# Patient Record
Sex: Male | Born: 1970 | Race: Black or African American | Hispanic: No | Marital: Married | Smoking: Former smoker
Health system: Southern US, Community
[De-identification: ages and names within clinical notes are randomized; demographics above are authoritative.]

## PROBLEM LIST (undated history)

## (undated) DIAGNOSIS — J189 Pneumonia, unspecified organism: Secondary | ICD-10-CM

## (undated) HISTORY — DX: Pneumonia, unspecified organism: J18.9

## (undated) HISTORY — PX: HAND TENDON SURGERY: SHX663

---

## 2011-02-05 ENCOUNTER — Emergency Department (HOSPITAL_COMMUNITY)
Admission: EM | Admit: 2011-02-05 | Discharge: 2011-02-05 | Disposition: A | Discharge status: Home / Self Care | Payer: PRIVATE HEALTH INSURANCE | Attending: Emergency Medicine | Admitting: Emergency Medicine

## 2011-02-05 ENCOUNTER — Encounter: Payer: Self-pay | Admitting: Emergency Medicine

## 2011-02-05 ENCOUNTER — Emergency Department (HOSPITAL_COMMUNITY): Payer: PRIVATE HEALTH INSURANCE

## 2011-02-05 DIAGNOSIS — R05 Cough: Secondary | ICD-10-CM | POA: Insufficient documentation

## 2011-02-05 DIAGNOSIS — F17200 Nicotine dependence, unspecified, uncomplicated: Secondary | ICD-10-CM | POA: Insufficient documentation

## 2011-02-05 DIAGNOSIS — R059 Cough, unspecified: Secondary | ICD-10-CM | POA: Insufficient documentation

## 2011-02-05 DIAGNOSIS — R1012 Left upper quadrant pain: Secondary | ICD-10-CM | POA: Insufficient documentation

## 2011-02-05 DIAGNOSIS — R1110 Vomiting, unspecified: Secondary | ICD-10-CM | POA: Insufficient documentation

## 2011-02-05 DIAGNOSIS — K219 Gastro-esophageal reflux disease without esophagitis: Secondary | ICD-10-CM | POA: Insufficient documentation

## 2011-02-05 LAB — DIFFERENTIAL
Basophils Absolute: 0 10*3/uL (ref 0.0–0.1)
Basophils Relative: 0 % (ref 0–1)
Eosinophils Absolute: 0.1 10*3/uL (ref 0.0–0.7)
Neutrophils Relative %: 57 % (ref 43–77)

## 2011-02-05 LAB — COMPREHENSIVE METABOLIC PANEL
ALT: 21 U/L (ref 0–53)
Albumin: 3.7 g/dL (ref 3.5–5.2)
Alkaline Phosphatase: 62 U/L (ref 39–117)
Potassium: 4 mEq/L (ref 3.5–5.1)
Sodium: 138 mEq/L (ref 135–145)
Total Protein: 7.2 g/dL (ref 6.0–8.3)

## 2011-02-05 LAB — CBC
MCH: 29.9 pg (ref 26.0–34.0)
MCHC: 34.5 g/dL (ref 30.0–36.0)
Platelets: 187 10*3/uL (ref 150–400)

## 2011-02-05 LAB — LIPASE, BLOOD: Lipase: 23 U/L (ref 11–59)

## 2011-02-05 MED ORDER — GI COCKTAIL ~~LOC~~
30.0000 mL | Freq: Once | ORAL | Status: AC
  Administered 2011-02-05: 30 mL via ORAL
  Filled 2011-02-05: qty 30

## 2011-02-05 MED ORDER — IOHEXOL 300 MG/ML  SOLN
100.0000 mL | Freq: Once | INTRAMUSCULAR | Status: AC | PRN
  Administered 2011-02-05: 100 mL via INTRAVENOUS

## 2011-02-05 MED ORDER — OMEPRAZOLE 20 MG PO CPDR: 40.0000 mg | DELAYED_RELEASE_CAPSULE | Freq: Every day | ORAL | Status: DC

## 2011-02-05 MED ORDER — SODIUM CHLORIDE 0.9 % IV BOLUS (SEPSIS): 1000.0000 mL | Freq: Once | INTRAVENOUS | Status: DC

## 2011-02-05 NOTE — ED Provider Notes (Signed)
Relates he's had left upper quadrant pain for the past month. He states sometimes it will radiate into his left flank. He states at times it is a burning pain. He denies that eating certain foods laying flat makes it worse. He states he started getting pain last night and has persisted through the night and today. He states now he has some burning in the epigastric area but it does not radiate into his chest. He states he's never had it before he has not noted anything that makes it better or worse. He denies any family history of kidney stones. Patient is awake and alert he is cooperative. On abdominal exam he does not have tenderness to palpation but he does indicate the left upper quadrant as the source of his pain. He does not have flank pain to palpation.  I saw and evaluated the patient, reviewed the resident's note and I agree with the findings and plan. Devoria Albe, MD, [facility]   Ward Givens, MD 02/05/11 [phone]

## 2011-02-05 NOTE — ED Provider Notes (Signed)
History     CSN: [id number] Arrival date & time: 02/05/2011  2:27 PM   First MD Initiated Contact with Patient 02/05/11 1552      Chief Complaint  Patient presents with  . Abdominal Pain    Left sided abdominal pain x 1 month    (Consider location/radiation/quality/duration/timing/severity/associated sxs/prior treatment) Patient is a 40 y.o. male presenting with abdominal pain. The history is provided by the patient.  Abdominal Pain The primary symptoms of the illness include abdominal pain and vomiting. The primary symptoms of the illness do not include diarrhea. Episode onset: 1 month ago. The onset of the illness was gradual. The problem has been gradually worsening.  Onset: 1 month ago. The pain came on gradually. The abdominal pain has been gradually worsening since its onset. The abdominal pain is located in the LUQ. The abdominal pain does not radiate. The abdominal pain is relieved by nothing.  The illness is associated with NSAID use. The patient states that she believes she is currently not pregnant. The patient has not had a change in bowel habit. Symptoms associated with the illness do not include chills, anorexia, heartburn, constipation or back pain. Associated symptoms comments: Vomiting, no nausea, indigestion, increased gas.  Marland Kitchen    History reviewed. No pertinent past medical history.  History reviewed. No pertinent past surgical history.  History reviewed. No pertinent family history.  History  Substance Use Topics  . Smoking status: Passive Smoker  . Smokeless tobacco: Never Used  . Alcohol Use: No      Review of Systems  Constitutional: Negative for chills.  Respiratory: Positive for cough.   Gastrointestinal: Positive for vomiting and abdominal pain. Negative for heartburn, diarrhea, constipation and anorexia.  Musculoskeletal: Negative for back pain.  All other systems reviewed and are negative.    Allergies  Review of patient's allergies indicates  no known allergies.  Home Medications   Current Outpatient Rx  Name Route Sig Dispense Refill  . IBUPROFEN 200 MG PO TABS Oral Take 400 mg by mouth every 6 (six) hours as needed. For pain.       BP 129/82  Pulse 82  Temp 98.1 F (36.7 C)  Resp 20  SpO2 99%  Physical Exam  Nursing note and vitals reviewed. Constitutional: He is oriented to person, place, and time. He appears well-developed and well-nourished. No distress.  HENT:  Head: Normocephalic and atraumatic.  Eyes: Conjunctivae are normal. Pupils are equal, round, and reactive to light.  Neck: Neck supple.  Cardiovascular: Normal rate and regular rhythm.   Pulmonary/Chest: Effort normal and breath sounds normal. He exhibits no tenderness.  Abdominal: Soft. There is no tenderness. There is no rebound and no guarding.  Musculoskeletal: Normal range of motion. He exhibits no edema and no tenderness.  Neurological: He is alert and oriented to person, place, and time. No cranial nerve deficit.  Skin: Skin is warm and dry. No erythema.    ED Course  Procedures (including critical care time)  Labs Reviewed  DIFFERENTIAL - Abnormal; Notable for the following:    Monocytes Relative 16 (*)    All other components within normal limits  CBC  COMPREHENSIVE METABOLIC PANEL  LIPASE, BLOOD  H. PYLORI ANTIBODY, IGG  URINALYSIS, ROUTINE W REFLEX MICROSCOPIC   Ct Abdomen Pelvis W Contrast  02/05/2011  *RADIOLOGY REPORT*  Clinical Data: Abdominal pain left-sided  CT ABDOMEN AND PELVIS WITH CONTRAST  Technique:  Multidetector CT imaging of the abdomen and pelvis was performed following the standard  protocol during bolus administration of intravenous contrast.  Contrast: OMNIPAQUE IOHEXOL 300 MG/ML IV SOLN  Comparison: None.  Findings: Lung bases are clear.  Hypodensity left lobe liver adjacent the falciform ligament as likely an area of focal fatty infiltration.  No other liver lesions.  Gallbladder and bile ducts are normal.   Pancreas spleen and kidneys are normal.  Negative for bowel obstruction.  No bowel thickening.  Appendix is normal.  No free fluid.  No mass or adenopathy.  IMPRESSION: Negative  Original Report Authenticated By: Camelia Phenes, M.D.     No diagnosis found.    MDM  Pt presented due to LUQ pain for about 1 month.  Reports burning sensation with intermittent sharp pain.  No aggrevating or alleving factors.  He is well appearing. Couple episodes of vomiting.  Has taken advil multiple times for back pain and headaches that are chronic in nature.  Abd exam is unremarkable.  Pt denies any reflux type symptoms.  Labs ordered.  Will CT abd/pelvis.  CT scan neg will d/c home.          Nena Alexander, MD 02/05/11 2138

## 2011-02-05 NOTE — ED Notes (Signed)
Pt reports left sided abdominal pain x 1 month. Pt denies nausea, vomiting or diarrhea.

## 2011-02-05 NOTE — ED Provider Notes (Signed)
See prior note   Ward Givens, MD 02/05/11 [phone]

## 2012-02-09 ENCOUNTER — Institutional Professional Consult (permissible substitution): Payer: PRIVATE HEALTH INSURANCE | Admitting: Cardiovascular Disease

## 2014-06-30 ENCOUNTER — Other Ambulatory Visit: Payer: Self-pay | Admitting: Physician Assistant

## 2014-06-30 ENCOUNTER — Ambulatory Visit (INDEPENDENT_AMBULATORY_CARE_PROVIDER_SITE_OTHER): Payer: BLUE CROSS/BLUE SHIELD | Admitting: Physician Assistant

## 2014-06-30 VITALS — BP 132/80 | HR 117 | Temp 98.7°F | Resp 17 | Ht 70.5 in | Wt 205.0 lb

## 2014-06-30 DIAGNOSIS — R000 Tachycardia, unspecified: Secondary | ICD-10-CM

## 2014-06-30 DIAGNOSIS — Z1329 Encounter for screening for other suspected endocrine disorder: Secondary | ICD-10-CM | POA: Diagnosis not present

## 2014-06-30 DIAGNOSIS — Z13228 Encounter for screening for other metabolic disorders: Secondary | ICD-10-CM

## 2014-06-30 DIAGNOSIS — Z13220 Encounter for screening for lipoid disorders: Secondary | ICD-10-CM | POA: Diagnosis not present

## 2014-06-30 DIAGNOSIS — Z0000 Encounter for general adult medical examination without abnormal findings: Secondary | ICD-10-CM | POA: Diagnosis not present

## 2014-06-30 DIAGNOSIS — Z125 Encounter for screening for malignant neoplasm of prostate: Secondary | ICD-10-CM | POA: Diagnosis not present

## 2014-06-30 DIAGNOSIS — Z130 Encounter for screening for diseases of the blood and blood-forming organs and certain disorders involving the immune mechanism: Secondary | ICD-10-CM | POA: Diagnosis not present

## 2014-06-30 LAB — POCT CBC
GRANULOCYTE PERCENT: 67.2 % (ref 37–80)
HCT, POC: 48.6 % (ref 43.5–53.7)
HEMOGLOBIN: 15.9 g/dL (ref 14.1–18.1)
LYMPH, POC: 2 (ref 0.6–3.4)
MCH, POC: 29.1 pg (ref 27–31.2)
MCHC: 32.6 g/dL (ref 31.8–35.4)
MCV: 89.1 fL (ref 80–97)
MID (CBC): 0.5 (ref 0–0.9)
MPV: 9.3 fL (ref 0–99.8)
POC GRANULOCYTE: 5 (ref 2–6.9)
POC LYMPH PERCENT: 26.5 %L (ref 10–50)
POC MID %: 6.3 % (ref 0–12)
Platelet Count, POC: 182 10*3/uL (ref 142–424)
RBC: 5.46 M/uL (ref 4.69–6.13)
RDW, POC: 13.1 %
WBC: 7.4 10*3/uL (ref 4.6–10.2)

## 2014-06-30 NOTE — Patient Instructions (Signed)
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
## 2014-07-01 LAB — COMPLETE METABOLIC PANEL WITH GFR
ALK PHOS: 57 U/L (ref 39–117)
ALT: 22 U/L (ref 0–53)
AST: 19 U/L (ref 0–37)
Albumin: 4 g/dL (ref 3.5–5.2)
BILIRUBIN TOTAL: 0.4 mg/dL (ref 0.2–1.2)
BUN: 14 mg/dL (ref 6–23)
CALCIUM: 9.3 mg/dL (ref 8.4–10.5)
CO2: 27 meq/L (ref 19–32)
CREATININE: 0.95 mg/dL (ref 0.50–1.35)
Chloride: 104 mEq/L (ref 96–112)
Glucose, Bld: 110 mg/dL — ABNORMAL HIGH (ref 70–99)
Potassium: 4.2 mEq/L (ref 3.5–5.3)
SODIUM: 140 meq/L (ref 135–145)
Total Protein: 6.9 g/dL (ref 6.0–8.3)

## 2014-07-02 LAB — T4, FREE: Free T4: 0.68 ng/dL — ABNORMAL LOW (ref 0.80–1.80)

## 2014-07-02 LAB — TSH: TSH: 10.53 u[IU]/mL — ABNORMAL HIGH (ref 0.350–4.500)

## 2014-07-02 LAB — T3, FREE: T3, Free: 2.9 pg/mL (ref 2.3–4.2)

## 2014-07-02 LAB — PSA: PSA: 0.54 ng/mL (ref <=4.00)

## 2014-07-03 LAB — LIPID PANEL

## 2014-07-06 ENCOUNTER — Telehealth: Payer: Self-pay | Admitting: Radiology

## 2014-07-06 NOTE — Telephone Encounter (Signed)
[facility] called and stated they did not have enough blood to run the Lipid panel

## 2014-07-09 NOTE — Telephone Encounter (Signed)
Did you want me to have pt RTC for redraw?

## 2014-07-09 NOTE — Telephone Encounter (Signed)
I will contact him about this.  I need to start him on a thyroid medication.  Thanks!!

## 2014-07-16 ENCOUNTER — Telehealth: Payer: Self-pay | Admitting: Physician Assistant

## 2014-07-16 DIAGNOSIS — E039 Hypothyroidism, unspecified: Secondary | ICD-10-CM

## 2014-07-16 MED ORDER — LEVOTHYROXINE SODIUM 50 MCG PO TABS: 50.0000 ug | ORAL_TABLET | Freq: Every day | ORAL | Status: DC

## 2014-07-16 NOTE — Telephone Encounter (Signed)
LM on voicemail: Hypothyroidism and ordered 50 levothyroxine.  He will need to return for recheck in 6 weeks.  He can return for a labs only lipid panel.

## 2014-07-22 ENCOUNTER — Telehealth: Payer: Self-pay | Admitting: Radiology

## 2014-07-25 ENCOUNTER — Other Ambulatory Visit (INDEPENDENT_AMBULATORY_CARE_PROVIDER_SITE_OTHER): Payer: BLUE CROSS/BLUE SHIELD | Admitting: Radiology

## 2014-07-25 ENCOUNTER — Other Ambulatory Visit: Payer: Self-pay | Admitting: Physician Assistant

## 2014-07-25 DIAGNOSIS — Z0000 Encounter for general adult medical examination without abnormal findings: Secondary | ICD-10-CM | POA: Diagnosis not present

## 2014-07-25 DIAGNOSIS — R7989 Other specified abnormal findings of blood chemistry: Secondary | ICD-10-CM

## 2014-07-25 DIAGNOSIS — Z13220 Encounter for screening for lipoid disorders: Secondary | ICD-10-CM

## 2014-07-25 LAB — LIPID PANEL
Cholesterol: 168 mg/dL (ref 0–200)
HDL: 43 mg/dL (ref >=40)
LDL CALC: 101 mg/dL — AB (ref 0–99)
Total CHOL/HDL Ratio: 3.9 Ratio
Triglycerides: 119 mg/dL (ref <150)
VLDL: 24 mg/dL (ref 0–40)

## 2014-07-25 LAB — T3, FREE: T3, Free: 3.5 pg/mL (ref 2.3–4.2)

## 2014-07-25 LAB — TSH: TSH: 9.617 u[IU]/mL — AB (ref 0.350–4.500)

## 2014-07-25 LAB — T4, FREE: FREE T4: 0.61 ng/dL — AB (ref 0.80–1.80)

## 2014-07-25 NOTE — Progress Notes (Signed)
Spoke with patient four days ago, who called with concerns of dizziness, fatigue, and feeling worse following the start of 50 levothyroxine.  I have advised him that he will come in for a labs only of tsh, free t4, free t3, and lipid panel.  We will recheck as symptoms are suggesting hyperthyroidism.  Patient has agreed and will return today.  Orders placed.

## 2014-07-25 NOTE — Progress Notes (Signed)
Lab only 

## 2014-08-22 ENCOUNTER — Telehealth: Payer: Self-pay

## 2014-08-22 DIAGNOSIS — R7989 Other specified abnormal findings of blood chemistry: Secondary | ICD-10-CM

## 2014-08-22 DIAGNOSIS — E039 Hypothyroidism, unspecified: Secondary | ICD-10-CM

## 2014-08-22 NOTE — Telephone Encounter (Signed)
He is referring to his thyroid, I believe. Endocrinologist?

## 2014-08-22 NOTE — Telephone Encounter (Signed)
Pt would like a referral for his throat. He can be reached at [phone]. He saw [place]ie English. Thank you

## 2014-08-30 ENCOUNTER — Other Ambulatory Visit: Payer: Self-pay | Admitting: Physician Assistant

## 2014-08-30 NOTE — Telephone Encounter (Signed)
I left voice message: I am unsure what he is referring to, but will make the referral.  Asked him to let us know if he is taking his thyroid medication, and how much.  What symptoms is he having.   He had called stating that he felt worse on the medication, and I had contacted him and advised him to take half the dose.  Again, I have made the referral to endocrinology, as I want to make sure that this TSH will go to a therapeutic level.

## 2014-10-03 ENCOUNTER — Ambulatory Visit: Payer: PRIVATE HEALTH INSURANCE | Admitting: Endocrinology

## 2014-11-14 ENCOUNTER — Encounter: Payer: Self-pay | Admitting: Endocrinology

## 2014-11-14 ENCOUNTER — Ambulatory Visit (INDEPENDENT_AMBULATORY_CARE_PROVIDER_SITE_OTHER): Payer: PRIVATE HEALTH INSURANCE | Admitting: Endocrinology

## 2014-11-14 ENCOUNTER — Ambulatory Visit: Payer: PRIVATE HEALTH INSURANCE | Admitting: Endocrinology

## 2014-11-14 VITALS — BP 114/70 | HR 76 | Temp 98.2°F | Resp 16 | Ht 70.5 in | Wt 211.0 lb

## 2014-11-14 DIAGNOSIS — E063 Autoimmune thyroiditis: Secondary | ICD-10-CM

## 2014-11-14 DIAGNOSIS — R6882 Decreased libido: Secondary | ICD-10-CM | POA: Diagnosis not present

## 2014-11-14 DIAGNOSIS — E038 Other specified hypothyroidism: Secondary | ICD-10-CM | POA: Diagnosis not present

## 2014-11-14 LAB — TSH: TSH: 4.74 u[IU]/mL — ABNORMAL HIGH (ref 0.35–4.50)

## 2014-11-14 LAB — T4, FREE: Free T4: 0.48 ng/dL — ABNORMAL LOW (ref 0.60–1.60)

## 2014-11-14 NOTE — Progress Notes (Signed)
Patient ID: Juan Richardson, male   DOB: 05/15/1970, 44 y.o.   MRN: [id number]            Reason for Appointment:  Hypothyroidism, new visit    History of Present Illness:   The Hypothyroidism was first diagnosed in 4/16   The symptoms consistent with hypothyroidism are: fatigue for about 1 year and some dry skin However the patient is somewhat inconsistent and unclear about his symptoms and difficult to get an accurate history  He was however having routine lab work done by his PCP in 4/16 when his TSH was found to be high at 10.5 He did not have any symptoms of cold sensitivity, difficulty concentrating or weight gain           The patient has been treated with levothyroxine 50 g daily which he took in April for about a week only  He says that when he started taking the medication he started feeling dizzy and also it was causing abdominal discomfort and he didn't feel well at all.  His TSH was repeated in about a week or 2 and was almost the same   The patient is not consistent in saying whether he is tired at this time or not. He is more concerned about some secretions in his throat in the morning and also occasional transient pinprick sensation in his chest    He is now here for further management    Lab Results  Component Value Date   FREET4 0.48* 11/14/2014   FREET4 0.61* 07/25/2014   FREET4 0.68* 06/30/2014   TSH 4.74* 11/14/2014   TSH 9.617* 07/25/2014   TSH 10.530* 06/30/2014     Past Medical History  Diagnosis Date  . Pneumonia     2013    History reviewed. No pertinent past surgical history.  Family History  Problem Relation Age of Onset  . Diabetes Mother     ?  Borderline diabetes  . Diabetes Maternal Uncle   . Thyroid disease Neg Hx     Social History:  reports that he has been passively smoking.  He has never used smokeless tobacco. He reports that he does not drink alcohol or use illicit drugs.  Allergies: No Known Allergies    Medication  List       This list is accurate as of: 11/14/14  3:03 PM.  Always use your most recent med list.               levothyroxine 50 MCG tablet  Commonly known as:  SYNTHROID, LEVOTHROID  Take 1 tablet (50 mcg total) by mouth daily.        Review of Systems:  Review of Systems  Constitutional: Negative for weight loss.  Eyes: Negative for blurred vision.  Respiratory: Positive for cough.   Gastrointestinal: Negative for constipation.  Genitourinary:       He has had decreased libido over the last year but no erectile dysfunction  Musculoskeletal: Negative for neck pain.  Neurological: Positive for dizziness and sensory change. Negative for headaches.       No recent dizziness, had more when he was trying to thyroid pill He sometimes feels, mostly at night  numbness in his hands  Psychiatric/Behavioral: Negative for depression. The patient has insomnia.     CARDIOLOGY: no history of high blood pressure.           ENDOCRINOLOGY:  no history of Diabetes.  Examination:    BP 114/70 mmHg  Pulse 76  Temp(Src) 98.2 F (36.8 C)  Resp 16  Ht 5' 10.5" (1.791 m)  Wt 211 lb (95.709 kg)  BMI 29.84 kg/m2  SpO2 98%  GENERAL:  Average build, well-nourished, looks well.   No pallor, clubbing, lymphadenopathy or edema.  Skin:  no rash or pigmentation.  EYES:  No prominence of the eyes or swelling of the eyelids  ENT: Oral mucosa and tongue normal.  Pharynx normal  THYROID:  Enlarged 2-2.5 times normal on the right side, smooth, firm, left side about 1.5-2 times normal, smooth.  No discrete nodule palpable.  No tenderness  HEART:  Normal  S1 and S2; no murmur or click.  CHEST:    Lungs: Vescicular breath sounds heard equally.  No crepitations/ wheeze.  ABDOMEN:  No distention.  Liver and spleen not palpable.  No other mass or tenderness.  NEUROLOGICAL: Reflexes are normal bilaterally at biceps and ankles.  JOINTS:  Normal.   Assessments  HYPOTHYROIDISM  with associated goiter, likely to be from Hashimoto thyroiditis  Although his TSH is mildly increased his free T4 is also low and not clear if he may also have concomitant secondary hypothyroidism  He claims that when he tried to take levothyroxine 50 g it made him feel dizzy and have abdominal distress even though he took it in the afternoon  However currently he does not appear to be significantly symptomatic and does not complain of much fatigue He has some nonspecific systemic symptoms Also may have some  symptoms suggestive of carpal tunnel syndrome  Treatment:    Recheck thyroid levels today   Check free testosterone level also to rule out hypogonadism since he has decreased libido and potentially may have pituitary dysfunction  Further management will depend on repeat testing. Discussed that he may be able to tolerate a lower dose of levothyroxine initially Discussed that levothyroxine this identical to the natural thyroid hormone made by the thyroid gland and also is described the potential symptoms of hypothyroidism Given him brochures on Hashimoto thyroiditis  Chantia Amalfitano 11/14/2014, 3:03 PM

## 2014-11-15 LAB — TESTOSTERONE, FREE, TOTAL, SHBG
SEX HORMONE BINDING: 18.8 nmol/L (ref 16.5–55.9)
TESTOSTERONE FREE: 4.9 pg/mL — AB (ref 6.8–21.5)
Testosterone: 157 ng/dL — ABNORMAL LOW (ref 348–1197)

## 2014-11-17 NOTE — Progress Notes (Signed)
Quick Note:  Please add LH, prolactin, IGF-I from [facility] for diagnosis of hypopituitarism, let him know testosterone level is low and we are evaluating further, hold off on thyroid medication for now ______

## 2014-11-19 LAB — SPECIMEN STATUS REPORT

## 2014-11-21 ENCOUNTER — Ambulatory Visit (INDEPENDENT_AMBULATORY_CARE_PROVIDER_SITE_OTHER): Payer: BLUE CROSS/BLUE SHIELD | Admitting: Emergency Medicine

## 2014-11-21 VITALS — BP 116/72 | HR 98 | Temp 99.3°F | Resp 18 | Ht 70.0 in | Wt 208.0 lb

## 2014-11-21 DIAGNOSIS — L500 Allergic urticaria: Secondary | ICD-10-CM

## 2014-11-21 MED ORDER — PREDNISONE 10 MG (48) PO TBPK: ORAL_TABLET | ORAL | Status: DC

## 2014-11-21 MED ORDER — HYDROXYZINE HCL 25 MG PO TABS: 25.0000 mg | ORAL_TABLET | Freq: Four times a day (QID) | ORAL | Status: AC | PRN

## 2014-11-21 MED ORDER — CIMETIDINE 800 MG PO TABS: 800.0000 mg | ORAL_TABLET | Freq: Every day | ORAL | Status: DC

## 2014-11-21 NOTE — Progress Notes (Signed)
Subjective:  Patient ID: Juan Richardson, male    DOB: 03/01/1971  Age: 44 y.o. MRN: [id number]  CC: Rash; Edema; Fever; Palpitations; and Abdominal Pain   HPI Juan Richardson presents  patient was at work 2 days ago developed generalized hives following's when he believes the chemical exposure work. His itching is so severe that it's keeping him up at night. He has had some nausea and diarrhea associated with the urticaria. He has no fever chills no blood mucus or pus in stools. No cough or wheezing he's had no improvement with over-the-counter medication   History Juan Richardson has a past medical history of Pneumonia.   He has past surgical history that includes Hand tendon surgery.   His  family history includes Diabetes in his maternal uncle and mother; Hypertension in his mother. There is no history of Thyroid disease.  He   reports that he has been passively smoking.  He has never used smokeless tobacco. He reports that he does not drink alcohol or use illicit drugs.  Outpatient Prescriptions Prior to Visit  Medication Sig Dispense Refill  . levothyroxine (SYNTHROID, LEVOTHROID) 50 MCG tablet Take 1 tablet (50 mcg total) by mouth daily. (Patient not taking: Reported on 11/14/2014) 90 tablet 0   No facility-administered medications prior to visit.    Social History   Social History  . Marital Status: Married    Spouse Name: N/A  . Number of Children: N/A  . Years of Education: N/A   Social History Main Topics  . Smoking status: Passive Smoke Exposure - Never Smoker  . Smokeless tobacco: Never Used  . Alcohol Use: No  . Drug Use: No  . Sexual Activity: Not Asked   Other Topics Concern  . None   Social History Narrative     Review of Systems  Constitutional: Negative for fever, chills and appetite change.  HENT: Negative for congestion, ear pain, postnasal drip, sinus pressure and sore throat.   Eyes: Negative for pain and redness.  Respiratory: Negative for cough,  shortness of breath and wheezing.   Cardiovascular: Negative for leg swelling.  Gastrointestinal: Negative for nausea, vomiting, abdominal pain, diarrhea, constipation and blood in stool.  Endocrine: Negative for polyuria.  Genitourinary: Negative for dysuria, urgency, frequency and flank pain.  Musculoskeletal: Negative for gait problem.  Skin: Positive for rash.  Neurological: Negative for weakness and headaches.  Psychiatric/Behavioral: Negative for confusion and decreased concentration. The patient is not nervous/anxious.     Objective:  BP 116/72 mmHg  Pulse 98  Temp(Src) 99.3 F (37.4 C) (Oral)  Resp 18  Ht 5\' 10"  (1.778 m)  Wt 208 lb (94.348 kg)  BMI 29.84 kg/m2  SpO2 98%  Physical Exam  Constitutional: He is oriented to person, place, and time. He appears well-developed and well-nourished. No distress.  HENT:  Head: Normocephalic and atraumatic.  Right Ear: External ear normal.  Left Ear: External ear normal.  Nose: Nose normal.  Eyes: Conjunctivae and EOM are normal. Pupils are equal, round, and reactive to light. No scleral icterus.  Neck: Normal range of motion. Neck supple. No tracheal deviation present.  Cardiovascular: Normal rate, regular rhythm and normal heart sounds.   Pulmonary/Chest: Effort normal. No respiratory distress. He has no wheezes. He has no rales.  Abdominal: He exhibits no mass. There is no tenderness. There is no rebound and no guarding.  Musculoskeletal: He exhibits no edema.  Lymphadenopathy:    He has no cervical adenopathy.  Neurological: He is alert and  oriented to person, place, and time. Coordination normal.  Skin: Skin is warm and dry. Rash noted.  Psychiatric: He has a normal mood and affect. His behavior is normal.   he has generalized urticaria    Assessment & Plan:   Juan Richardson was seen today for rash, edema, fever, palpitations and abdominal pain.  Diagnoses and all orders for this visit:  Allergic urticaria -     Ambulatory  referral to Allergy  Other orders -     hydrOXYzine (ATARAX/VISTARIL) 25 MG tablet; Take 1 tablet (25 mg total) by mouth every 6 (six) hours as needed for itching. -     cimetidine (TAGAMET) 800 MG tablet; Take 1 tablet (800 mg total) by mouth at bedtime. -     predniSONE (STERAPRED UNI-PAK 48 TAB) 10 MG (48) TBPK tablet; TAKE AS DIRECTED ON PACKAGE  I am having [name] start on hydrOXYzine, cimetidine, and predniSONE. I am also having him maintain his levothyroxine.  Meds ordered this encounter  Medications  . hydrOXYzine (ATARAX/VISTARIL) 25 MG tablet    Sig: Take 1 tablet (25 mg total) by mouth every 6 (six) hours as needed for itching.    Dispense:  40 tablet    Refill:  0  . cimetidine (TAGAMET) 800 MG tablet    Sig: Take 1 tablet (800 mg total) by mouth at bedtime.    Dispense:  30 tablet    Refill:  0  . predniSONE (STERAPRED UNI-PAK 48 TAB) 10 MG (48) TBPK tablet    Sig: TAKE AS DIRECTED ON PACKAGE    Dispense:  48 tablet    Refill:  0    Appropriate red flag conditions were discussed with the patient as well as actions that should be taken.  Patient expressed his understanding.  Follow-up: Return if symptoms worsen or fail to improve.  Carmelina Dane, MD

## 2014-11-21 NOTE — Patient Instructions (Signed)
Hives Hives are itchy, red, swollen areas of the skin. They can vary in size and location on your body. Hives can come and go for hours or several days (acute hives) or for several weeks (chronic hives). Hives do not spread from person to person (noncontagious). They may get worse with scratching, exercise, and emotional stress. CAUSES   Allergic reaction to food, additives, or drugs.  Infections, including the common cold.  Illness, such as vasculitis, lupus, or thyroid disease.  Exposure to sunlight, heat, or cold.  Exercise.  Stress.  Contact with chemicals. SYMPTOMS   Red or white swollen patches on the skin. The patches may change size, shape, and location quickly and repeatedly.  Itching.  Swelling of the hands, feet, and face. This may occur if hives develop deeper in the skin. DIAGNOSIS  Your caregiver can usually tell what is wrong by performing a physical exam. Skin or blood tests may also be done to determine the cause of your hives. In some cases, the cause cannot be determined. TREATMENT  Mild cases usually get better with medicines such as antihistamines. Severe cases may require an emergency epinephrine injection. If the cause of your hives is known, treatment includes avoiding that trigger.  HOME CARE INSTRUCTIONS   Avoid causes that trigger your hives.  Take antihistamines as directed by your caregiver to reduce the severity of your hives. Non-sedating or low-sedating antihistamines are usually recommended. Do not drive while taking an antihistamine.  Take any other medicines prescribed for itching as directed by your caregiver.  Wear loose-fitting clothing.  Keep all follow-up appointments as directed by your caregiver. SEEK MEDICAL CARE IF:   You have persistent or severe itching that is not relieved with medicine.  You have painful or swollen joints. SEEK IMMEDIATE MEDICAL CARE IF:   You have a fever.  Your tongue or lips are swollen.  You have  trouble breathing or swallowing.  You feel tightness in the throat or chest.  You have abdominal pain. These problems may be the first sign of a life-threatening allergic reaction. Call your local emergency services (911 in [place] MAKE SURE YOU:   Understand these instructions.  Will watch your condition.  Will get help right away if you are not doing well or get worse. Document Released: 03/07/2005 Document Revised: 03/12/2013 Document Reviewed: 05/31/2011 [employer] Patient Information 2015 [employer]. This information is not intended to replace advice given to you by your health care provider. Make sure you discuss any questions you have with your health care provider.  

## 2014-11-22 ENCOUNTER — Emergency Department (HOSPITAL_COMMUNITY): Payer: BLUE CROSS/BLUE SHIELD

## 2014-11-22 ENCOUNTER — Encounter (HOSPITAL_COMMUNITY): Payer: Self-pay | Admitting: Nurse Practitioner

## 2014-11-22 ENCOUNTER — Emergency Department (HOSPITAL_COMMUNITY)
Admission: EM | Admit: 2014-11-22 | Discharge: 2014-11-22 | Disposition: A | Discharge status: Home / Self Care | Payer: BLUE CROSS/BLUE SHIELD | Attending: Emergency Medicine | Admitting: Emergency Medicine

## 2014-11-22 DIAGNOSIS — Y939 Activity, unspecified: Secondary | ICD-10-CM | POA: Diagnosis not present

## 2014-11-22 DIAGNOSIS — Z79899 Other long term (current) drug therapy: Secondary | ICD-10-CM | POA: Diagnosis not present

## 2014-11-22 DIAGNOSIS — Y929 Unspecified place or not applicable: Secondary | ICD-10-CM | POA: Insufficient documentation

## 2014-11-22 DIAGNOSIS — Y998 Other external cause status: Secondary | ICD-10-CM | POA: Insufficient documentation

## 2014-11-22 DIAGNOSIS — T7840XA Allergy, unspecified, initial encounter: Secondary | ICD-10-CM | POA: Diagnosis not present

## 2014-11-22 DIAGNOSIS — Z8701 Personal history of pneumonia (recurrent): Secondary | ICD-10-CM | POA: Diagnosis not present

## 2014-11-22 DIAGNOSIS — L509 Urticaria, unspecified: Secondary | ICD-10-CM | POA: Diagnosis not present

## 2014-11-22 DIAGNOSIS — X58XXXA Exposure to other specified factors, initial encounter: Secondary | ICD-10-CM | POA: Diagnosis not present

## 2014-11-22 DIAGNOSIS — K1379 Other lesions of oral mucosa: Secondary | ICD-10-CM | POA: Insufficient documentation

## 2014-11-22 DIAGNOSIS — R109 Unspecified abdominal pain: Secondary | ICD-10-CM | POA: Diagnosis present

## 2014-11-22 LAB — CBC
HEMATOCRIT: 44.8 % (ref 39.0–52.0)
Hemoglobin: 15.7 g/dL (ref 13.0–17.0)
MCH: 30.4 pg (ref 26.0–34.0)
MCHC: 35 g/dL (ref 30.0–36.0)
MCV: 86.8 fL (ref 78.0–100.0)
Platelets: 193 10*3/uL (ref 150–400)
RBC: 5.16 MIL/uL (ref 4.22–5.81)
RDW: 12.5 % (ref 11.5–15.5)
WBC: 13.9 10*3/uL — AB (ref 4.0–10.5)

## 2014-11-22 LAB — URINALYSIS, ROUTINE W REFLEX MICROSCOPIC
Bilirubin Urine: NEGATIVE
GLUCOSE, UA: NEGATIVE mg/dL
Ketones, ur: NEGATIVE mg/dL
Leukocytes, UA: NEGATIVE
Nitrite: NEGATIVE
Protein, ur: NEGATIVE mg/dL
SPECIFIC GRAVITY, URINE: 1.008 (ref 1.005–1.030)
Urobilinogen, UA: 0.2 mg/dL (ref 0.0–1.0)
pH: 6.5 (ref 5.0–8.0)

## 2014-11-22 LAB — PROLACTIN

## 2014-11-22 LAB — COMPREHENSIVE METABOLIC PANEL
ALT: 24 U/L (ref 17–63)
AST: 22 U/L (ref 15–41)
Albumin: 3.6 g/dL (ref 3.5–5.0)
Alkaline Phosphatase: 43 U/L (ref 38–126)
Anion gap: 7 (ref 5–15)
BUN: 8 mg/dL (ref 6–20)
CHLORIDE: 102 mmol/L (ref 101–111)
CO2: 25 mmol/L (ref 22–32)
Calcium: 8.7 mg/dL — ABNORMAL LOW (ref 8.9–10.3)
Creatinine, Ser: 1.22 mg/dL (ref 0.61–1.24)
Glucose, Bld: 135 mg/dL — ABNORMAL HIGH (ref 65–99)
POTASSIUM: 3.7 mmol/L (ref 3.5–5.1)
SODIUM: 134 mmol/L — AB (ref 135–145)
Total Bilirubin: 1.4 mg/dL — ABNORMAL HIGH (ref 0.3–1.2)
Total Protein: 6.2 g/dL — ABNORMAL LOW (ref 6.5–8.1)

## 2014-11-22 LAB — INSULIN-LIKE GROWTH FACTOR: INSULIN LIKE GF 1: 114 ng/mL (ref 75–216)

## 2014-11-22 LAB — LUTEINIZING HORMONE: LH: 3 m[IU]/mL (ref 1.7–8.6)

## 2014-11-22 LAB — URINE MICROSCOPIC-ADD ON

## 2014-11-22 LAB — LIPASE, BLOOD: Lipase: 14 U/L — ABNORMAL LOW (ref 22–51)

## 2014-11-22 LAB — SPECIMEN STATUS REPORT

## 2014-11-22 MED ORDER — KETOROLAC TROMETHAMINE 30 MG/ML IJ SOLN
30.0000 mg | Freq: Once | INTRAMUSCULAR | Status: AC
  Administered 2014-11-22: 30 mg via INTRAVENOUS
  Filled 2014-11-22: qty 1

## 2014-11-22 MED ORDER — IBUPROFEN 100 MG/5ML PO SUSP: 600.0000 mg | Freq: Four times a day (QID) | ORAL | Status: DC | PRN

## 2014-11-22 MED ORDER — ONDANSETRON 4 MG PO TBDP: ORAL_TABLET | ORAL | Status: DC

## 2014-11-22 MED ORDER — METHYLPREDNISOLONE SODIUM SUCC 125 MG IJ SOLR
125.0000 mg | Freq: Once | INTRAMUSCULAR | Status: AC
  Administered 2014-11-22: 125 mg via INTRAVENOUS
  Filled 2014-11-22: qty 2

## 2014-11-22 MED ORDER — FAMOTIDINE 40 MG/5ML PO SUSR: 20.0000 mg | Freq: Every day | ORAL | Status: AC

## 2014-11-22 MED ORDER — DIPHENHYDRAMINE HCL 12.5 MG/5ML PO SYRP: 25.0000 mg | ORAL_SOLUTION | Freq: Four times a day (QID) | ORAL | Status: AC

## 2014-11-22 MED ORDER — DIPHENHYDRAMINE HCL 50 MG/ML IJ SOLN
25.0000 mg | Freq: Once | INTRAMUSCULAR | Status: AC
  Administered 2014-11-22: 25 mg via INTRAVENOUS
  Filled 2014-11-22: qty 1

## 2014-11-22 MED ORDER — FAMOTIDINE IN NACL 20-0.9 MG/50ML-% IV SOLN
20.0000 mg | Freq: Once | INTRAVENOUS | Status: AC
  Administered 2014-11-22: 20 mg via INTRAVENOUS
  Filled 2014-11-22: qty 50

## 2014-11-22 MED ORDER — ONDANSETRON HCL 4 MG/2ML IJ SOLN
4.0000 mg | Freq: Once | INTRAMUSCULAR | Status: AC
  Administered 2014-11-22: 4 mg via INTRAVENOUS
  Filled 2014-11-22: qty 2

## 2014-11-22 MED ORDER — PREDNISOLONE 15 MG/5ML PO SOLN: 60.0000 mg | Freq: Every day | ORAL | Status: AC

## 2014-11-22 MED ORDER — SODIUM CHLORIDE 0.9 % IV BOLUS (SEPSIS)
1000.0000 mL | Freq: Once | INTRAVENOUS | Status: AC
  Administered 2014-11-22: 1000 mL via INTRAVENOUS

## 2014-11-22 NOTE — Discharge Instructions (Signed)
Read the information below.  Use the prescribed medication as directed.  Please discuss all new medications with your pharmacist.  You may return to the Emergency Department at any time for worsening condition or any new symptoms that concern you.   If you develop itching or swelling in your mouth or throat or any difficulty swallowing or breathing, call 911 or return to the Emergency Department immediately for a recheck.   If you develop high fevers, worsening abdominal pain, uncontrolled vomiting, or are unable to tolerate fluids by mouth, return to the ER for a recheck.     Allergies Allergies may happen from anything your body is sensitive to. This may be food, medicines, pollens, chemicals, and nearly anything around you in everyday life that produces allergens. An allergen is anything that causes an allergy producing substance. Heredity is often a factor in causing these problems. This means you may have some of the same allergies as your parents. Food allergies happen in all age groups. Food allergies are some of the most severe and life threatening. Some common food allergies are cow's milk, seafood, eggs, nuts, wheat, and soybeans. SYMPTOMS   Swelling around the mouth.  An itchy red rash or hives.  Vomiting or diarrhea.  Difficulty breathing. SEVERE ALLERGIC REACTIONS ARE LIFE-THREATENING. This reaction is called anaphylaxis. It can cause the mouth and throat to swell and cause difficulty with breathing and swallowing. In severe reactions only a trace amount of food (for example, peanut oil in a salad) may cause death within seconds. Seasonal allergies occur in all age groups. These are seasonal because they usually occur during the same season every year. They may be a reaction to molds, grass pollens, or tree pollens. Other causes of problems are house dust mite allergens, pet dander, and mold spores. The symptoms often consist of nasal congestion, a runny itchy nose associated with  sneezing, and tearing itchy eyes. There is often an associated itching of the mouth and ears. The problems happen when you come in contact with pollens and other allergens. Allergens are the particles in the air that the body reacts to with an allergic reaction. This causes you to release allergic antibodies. Through a chain of events, these eventually cause you to release histamine into the blood stream. Although it is meant to be protective to the body, it is this release that causes your discomfort. This is why you were given anti-histamines to feel better. If you are unable to pinpoint the offending allergen, it may be determined by skin or blood testing. Allergies cannot be cured but can be controlled with medicine. Hay fever is a collection of all or some of the seasonal allergy problems. It may often be treated with simple over-the-counter medicine such as diphenhydramine. Take medicine as directed. Do not drink alcohol or drive while taking this medicine. Check with your caregiver or package insert for child dosages. If these medicines are not effective, there are many new medicines your caregiver can prescribe. Stronger medicine such as nasal spray, eye drops, and corticosteroids may be used if the first things you try do not work well. Other treatments such as immunotherapy or desensitizing injections can be used if all else fails. Follow up with your caregiver if problems continue. These seasonal allergies are usually not life threatening. They are generally more of a nuisance that can often be handled using medicine. HOME CARE INSTRUCTIONS   If unsure what causes a reaction, keep a diary of foods eaten and symptoms that  follow. Avoid foods that cause reactions.  If hives or rash are present:  Take medicine as directed.  You may use an over-the-counter antihistamine (diphenhydramine) for hives and itching as needed.  Apply cold compresses (cloths) to the skin or take baths in cool water.  Avoid hot baths or showers. Heat will make a rash and itching worse.  If you are severely allergic:  Following a treatment for a severe reaction, hospitalization is often required for closer follow-up.  Wear a medic-alert bracelet or necklace stating the allergy.  You and your family must learn how to give adrenaline or use an anaphylaxis kit.  If you have had a severe reaction, always carry your anaphylaxis kit or EpiPen with you. Use this medicine as directed by your caregiver if a severe reaction is occurring. Failure to do so could have a fatal outcome. SEEK MEDICAL CARE IF:  You suspect a food allergy. Symptoms generally happen within 30 minutes of eating a food.  Your symptoms have not gone away within 2 days or are getting worse.  You develop new symptoms.  You want to retest yourself or your child with a food or drink you think causes an allergic reaction. Never do this if an anaphylactic reaction to that food or drink has happened before. Only do this under the care of a caregiver. SEEK IMMEDIATE MEDICAL CARE IF:   You have difficulty breathing, are wheezing, or have a tight feeling in your chest or throat.  You have a swollen mouth, or you have hives, swelling, or itching all over your body.  You have had a severe reaction that has responded to your anaphylaxis kit or an EpiPen. These reactions may return when the medicine has worn off. These reactions should be considered life threatening. MAKE SURE YOU:   Understand these instructions.  Will watch your condition.  Will get help right away if you are not doing well or get worse. Document Released: 05/31/2002 Document Revised: 07/02/2012 Document Reviewed: 11/05/2007 [facility] Patient Information 2015 [place], [place]. This information is not intended to replace advice given to you by your health care provider. Make sure you discuss any questions you have with your health care provider.

## 2014-11-22 NOTE — ED Notes (Signed)
Reported pain after eating to [place], PA-C. No new orders at this time.

## 2014-11-22 NOTE — ED Provider Notes (Signed)
CSN: [id number]     Arrival date & time 11/22/14  1308 History   None    Chief Complaint  Patient presents with  . Abdominal Pain     (Consider location/radiation/quality/duration/timing/severity/associated sxs/prior Treatment) The history is provided by the patient.     Pt p/w 4 days of symptoms that began with itching and swelling of the face, progressed to diffuse pruritic rash throughout the body, diarrhea (resolved) , one day of cough and SOB (2 days ago, resolved), now diffuse abdominal pain, N/V, not tolerating PO, constipation.  His abdominal pain is diffuse and described as burning.  He has decreased urination, dark urine, urinary hesitancy.  Associated chills, myalgias, fevers.  Was seen by PCP two days ago and given cimetidine, hydroxizine, but he cannot tolerate these.    Developed these symptoms while at work.  Noticed some spider webs around him which he knocked down and continued his work.  He also had some liquid spray touch his skin two days later after his symptoms had already started.   Denies any new medications, recent travel, sick contacts.  No new personal care products.  The rash does not hurt.  Denies sore throat, CP, SOB.    Past Medical History  Diagnosis Date  . Pneumonia     2013   Past Surgical History  Procedure Laterality Date  . Hand tendon surgery     Family History  Problem Relation Age of Onset  . Diabetes Mother     ?  Borderline diabetes  . Hypertension Mother   . Diabetes Maternal Uncle   . Thyroid disease Neg Hx    Social History  Substance Use Topics  . Smoking status: Passive Smoke Exposure - Never Smoker  . Smokeless tobacco: Never Used  . Alcohol Use: No    Review of Systems  All other systems reviewed and are negative.     Allergies  Review of patient's allergies indicates no known allergies.  Home Medications   Prior to Admission medications   Medication Sig Start Date End Date Taking? Authorizing Provider  cimetidine  (TAGAMET) 800 MG tablet Take 1 tablet (800 mg total) by mouth at bedtime. 11/21/14   Carmelina Dane, MD  hydrOXYzine (ATARAX/VISTARIL) 25 MG tablet Take 1 tablet (25 mg total) by mouth every 6 (six) hours as needed for itching. 11/21/14   Carmelina Dane, MD  levothyroxine (SYNTHROID, LEVOTHROID) 50 MCG tablet Take 1 tablet (50 mcg total) by mouth daily. Patient not taking: Reported on 11/14/2014 07/16/14   Collie Siad English, PA  predniSONE (STERAPRED UNI-PAK 48 TAB) 10 MG (48) TBPK tablet TAKE AS DIRECTED ON PACKAGE 11/21/14   Carmelina Dane, MD   BP 123/78 mmHg  Pulse 84  Temp(Src) 99.8 F (37.7 C) (Oral)  Resp 18  Ht 6' (1.829 m)  Wt 208 lb (94.348 kg)  BMI 28.20 kg/m2  SpO2 100% Physical Exam  Constitutional: He appears well-developed and well-nourished. No distress.  HENT:  Head: Normocephalic and atraumatic.  Mouth/Throat: Oral lesions present. Posterior oropharyngeal erythema present. No oropharyngeal exudate or posterior oropharyngeal edema.  Eyes: Conjunctivae are normal.  Neck: Normal range of motion. Neck supple.  Cardiovascular: Normal rate and regular rhythm.   Pulmonary/Chest: Effort normal and breath sounds normal. No respiratory distress. He has no wheezes. He has no rales.  Abdominal: Soft. He exhibits no distension and no mass. There is no tenderness. There is no rebound and no guarding.  Musculoskeletal: He exhibits no edema.  Neurological:  He is alert. He exhibits normal muscle tone.  Skin: Rash noted. He is not diaphoretic.  Diffuse urticarial rash.  Oropharynx with erythematous lesions.    Psychiatric: He has a normal mood and affect. His behavior is normal.  Nursing note and vitals reviewed.   ED Course  Procedures (including critical care time) Labs Review Labs Reviewed  COMPREHENSIVE METABOLIC PANEL - Abnormal; Notable for the following:    Sodium 134 (*)    Glucose, Bld 135 (*)    Calcium 8.7 (*)    Total Protein 6.2 (*)    Total Bilirubin 1.4  (*)    All other components within normal limits  CBC - Abnormal; Notable for the following:    WBC 13.9 (*)    All other components within normal limits  URINALYSIS, ROUTINE W REFLEX MICROSCOPIC (NOT AT [facility]) - Abnormal; Notable for the following:    Hgb urine dipstick TRACE (*)    All other components within normal limits  LIPASE, BLOOD - Abnormal; Notable for the following:    Lipase 14 (*)    All other components within normal limits  URINE MICROSCOPIC-ADD ON    Imaging Review Dg Abd Acute W/chest  11/22/2014   CLINICAL DATA:  Abdominal pain and constipation for 5 days.  EXAM: DG ABDOMEN ACUTE W/ 1V CHEST  COMPARISON:  09/24/2010 chest radiograph  FINDINGS: The cardiomediastinal silhouette is unremarkable.  There is no evidence of airspace disease, pleural effusion or pneumothorax.  There is no evidence of bowel obstruction or pneumoperitoneum.  No suspicious calcifications are identified.  No acute bony abnormalities are identified.  IMPRESSION: Negative abdominal radiographs.  No acute cardiopulmonary disease.   Electronically Signed   By: Harmon Pier M.D.   On: 11/22/2014 18:40   I have personally reviewed and evaluated these images and lab results as part of my medical decision-making.   EKG Interpretation None       3:31 PM Discussed pt history, physical exam, and treatment plan with Dr Clydene Pugh who recommends treatment as allergic reaction.    5:41 PM Pt reports continued abdominal pain.  Repeat abdominal exam remains benign.  Mild diffuse tenderness, no guarding or rebound.    MDM   Final diagnoses:  Allergic reaction, initial encounter   Afebrile, nontoxic patient with allergic reaction to unknown substance.  Pruritic urticarial rash, GI symptoms over several days.  No airway concerns currently.  Treated with IV solu medrol, benadryl, pepcid with improvement.  Pt did complain of diffuse abdominal pain - abdominal exam remained nonsurgical and nonfocal.   Labs significant for  leukocytosis.  D/C home with prednisone, benadryl, pepcid, zofran, ibuprofen.  Pt requested liquid medications because he states he can never take any pills because they hurt his stomach; he did not want to try to crush pills and put them in food.  Discussed result, findings, treatment, and follow up  with patient.  Pt given return precautions.  Pt verbalizes understanding and agrees with plan.         Trixie Dredge, PA-C 11/23/14 8119  Lyndal Pulley, MD 11/23/14 [phone]

## 2014-11-22 NOTE — ED Notes (Signed)
Patient transported to X-ray 

## 2014-11-22 NOTE — ED Notes (Signed)
Meal and ice water provided as PO challenge.

## 2014-11-22 NOTE — ED Notes (Signed)
He reports itchy red rash to entire body and abd pain for past 5 days. He went to PCP and they rxed cimetidine and hydroxyzine with no relief of complaints. He has also had diarrhea. He is a&Ox4, breathing easily

## 2014-11-23 ENCOUNTER — Encounter (HOSPITAL_COMMUNITY): Payer: Self-pay | Admitting: Nurse Practitioner

## 2014-11-23 ENCOUNTER — Emergency Department (HOSPITAL_COMMUNITY)
Admission: EM | Admit: 2014-11-23 | Discharge: 2014-11-23 | Disposition: A | Discharge status: Home / Self Care | Payer: BLUE CROSS/BLUE SHIELD | Attending: Emergency Medicine | Admitting: Emergency Medicine

## 2014-11-23 ENCOUNTER — Emergency Department (HOSPITAL_COMMUNITY): Payer: BLUE CROSS/BLUE SHIELD

## 2014-11-23 DIAGNOSIS — R079 Chest pain, unspecified: Secondary | ICD-10-CM | POA: Diagnosis not present

## 2014-11-23 DIAGNOSIS — L500 Allergic urticaria: Secondary | ICD-10-CM | POA: Diagnosis not present

## 2014-11-23 DIAGNOSIS — X58XXXD Exposure to other specified factors, subsequent encounter: Secondary | ICD-10-CM | POA: Insufficient documentation

## 2014-11-23 DIAGNOSIS — L509 Urticaria, unspecified: Secondary | ICD-10-CM

## 2014-11-23 DIAGNOSIS — T7840XD Allergy, unspecified, subsequent encounter: Secondary | ICD-10-CM | POA: Diagnosis not present

## 2014-11-23 DIAGNOSIS — R21 Rash and other nonspecific skin eruption: Secondary | ICD-10-CM | POA: Diagnosis present

## 2014-11-23 LAB — CBC WITH DIFFERENTIAL/PLATELET
BASOS PCT: 0 % (ref 0–1)
Basophils Absolute: 0 10*3/uL (ref 0.0–0.1)
EOS ABS: 0 10*3/uL (ref 0.0–0.7)
EOS PCT: 0 % (ref 0–5)
HCT: 40.4 % (ref 39.0–52.0)
Hemoglobin: 14 g/dL (ref 13.0–17.0)
Lymphocytes Relative: 7 % — ABNORMAL LOW (ref 12–46)
Lymphs Abs: 1.2 10*3/uL (ref 0.7–4.0)
MCH: 30.2 pg (ref 26.0–34.0)
MCHC: 34.7 g/dL (ref 30.0–36.0)
MCV: 87.3 fL (ref 78.0–100.0)
MONO ABS: 0.9 10*3/uL (ref 0.1–1.0)
MONOS PCT: 5 % (ref 3–12)
Neutro Abs: 15.7 10*3/uL — ABNORMAL HIGH (ref 1.7–7.7)
Neutrophils Relative %: 88 % — ABNORMAL HIGH (ref 43–77)
Platelets: 213 10*3/uL (ref 150–400)
RBC: 4.63 MIL/uL (ref 4.22–5.81)
RDW: 12.7 % (ref 11.5–15.5)
WBC: 17.8 10*3/uL — ABNORMAL HIGH (ref 4.0–10.5)

## 2014-11-23 LAB — URINALYSIS, ROUTINE W REFLEX MICROSCOPIC
Bilirubin Urine: NEGATIVE
Glucose, UA: NEGATIVE mg/dL
Hgb urine dipstick: NEGATIVE
KETONES UR: NEGATIVE mg/dL
LEUKOCYTES UA: NEGATIVE
NITRITE: NEGATIVE
PH: 7 (ref 5.0–8.0)
Protein, ur: NEGATIVE mg/dL
Specific Gravity, Urine: 1.001 — ABNORMAL LOW (ref 1.005–1.030)
Urobilinogen, UA: 0.2 mg/dL (ref 0.0–1.0)

## 2014-11-23 LAB — LIPASE, BLOOD: LIPASE: 18 U/L — AB (ref 22–51)

## 2014-11-23 LAB — COMPREHENSIVE METABOLIC PANEL
ALBUMIN: 3.4 g/dL — AB (ref 3.5–5.0)
ALT: 28 U/L (ref 17–63)
ANION GAP: 9 (ref 5–15)
AST: 36 U/L (ref 15–41)
Alkaline Phosphatase: 43 U/L (ref 38–126)
BILIRUBIN TOTAL: 0.6 mg/dL (ref 0.3–1.2)
BUN: 13 mg/dL (ref 6–20)
CO2: 23 mmol/L (ref 22–32)
Calcium: 8.6 mg/dL — ABNORMAL LOW (ref 8.9–10.3)
Chloride: 105 mmol/L (ref 101–111)
Creatinine, Ser: 1.01 mg/dL (ref 0.61–1.24)
GFR calc Af Amer: >60 mL/min (ref >60)
GFR calc non Af Amer: >60 mL/min (ref >60)
GLUCOSE: 160 mg/dL — AB (ref 65–99)
POTASSIUM: 4.2 mmol/L (ref 3.5–5.1)
SODIUM: 137 mmol/L (ref 135–145)
TOTAL PROTEIN: 6.5 g/dL (ref 6.5–8.1)

## 2014-11-23 LAB — I-STAT CG4 LACTIC ACID, ED: LACTIC ACID, VENOUS: 3.12 mmol/L — AB (ref 0.5–2.0)

## 2014-11-23 LAB — TROPONIN I

## 2014-11-23 MED ORDER — GI COCKTAIL ~~LOC~~
30.0000 mL | Freq: Once | ORAL | Status: AC
  Administered 2014-11-23: 30 mL via ORAL
  Filled 2014-11-23: qty 30

## 2014-11-23 MED ORDER — FAMOTIDINE IN NACL 20-0.9 MG/50ML-% IV SOLN
20.0000 mg | Freq: Once | INTRAVENOUS | Status: AC
  Administered 2014-11-23: 20 mg via INTRAVENOUS
  Filled 2014-11-23: qty 50

## 2014-11-23 MED ORDER — ONDANSETRON HCL 4 MG/2ML IJ SOLN
4.0000 mg | Freq: Once | INTRAMUSCULAR | Status: AC
  Administered 2014-11-23: 4 mg via INTRAVENOUS
  Filled 2014-11-23: qty 2

## 2014-11-23 MED ORDER — DIPHENHYDRAMINE HCL 50 MG/ML IJ SOLN
50.0000 mg | Freq: Once | INTRAMUSCULAR | Status: AC
  Administered 2014-11-23: 50 mg via INTRAVENOUS
  Filled 2014-11-23: qty 1

## 2014-11-23 MED ORDER — IOHEXOL 350 MG/ML SOLN
100.0000 mL | Freq: Once | INTRAVENOUS | Status: AC | PRN
  Administered 2014-11-23: 100 mL via INTRAVENOUS

## 2014-11-23 MED ORDER — METHYLPREDNISOLONE SODIUM SUCC 125 MG IJ SOLR
125.0000 mg | Freq: Once | INTRAMUSCULAR | Status: AC
  Administered 2014-11-23: 125 mg via INTRAVENOUS
  Filled 2014-11-23: qty 2

## 2014-11-23 MED ORDER — SODIUM CHLORIDE 0.9 % IV BOLUS (SEPSIS)
1000.0000 mL | Freq: Once | INTRAVENOUS | Status: AC
  Administered 2014-11-23: 1000 mL via INTRAVENOUS

## 2014-11-23 NOTE — ED Notes (Signed)
Pt returned from Ct.

## 2014-11-23 NOTE — ED Notes (Signed)
Pt seen here yesterday for same. Did not take his Zofran at home because his stomach hurt. Pt still reports abdominal pain and nausea. Pt still has rash.

## 2014-11-23 NOTE — ED Notes (Addendum)
He reports he was here yesterday and dx with allergic reaction, he was sent home with multiple medications which he tried some of but states he continues to have same symptoms that are unrelieved by the medications: rash on body, abd pain, nasuea. He did not try to zofran he was given because his stomach hurt too bad. He is A&Ox4, resp e/u

## 2014-11-23 NOTE — ED Provider Notes (Signed)
CSN: [id number]     Arrival date & time 11/23/14  1336 History   First MD Initiated Contact with Patient 11/23/14 1627     Chief Complaint  Patient presents with  . Allergic Reaction     (Consider location/radiation/quality/duration/timing/severity/associated sxs/prior Treatment) HPI Comments: Patient presents to the ER for multiple complaints. Patient reports that he was seen yesterday for itchy rash and diagnosed with allergic reaction. He did have abdominal pain at that time as well. Patient reports that the abdominal pain resolved after he was given pain medicine in the ER. The pain has not returned, but now he is experiencing burning sensation in the upper abdomen that radiates up into the chest. He has not been able to hold on any medications. His rash has returned as well, describes diffuse itchy raised red rash. No difficulty breathing. No tongue or throat swelling.  Patient is a 44 y.o. male presenting with allergic reaction.  Allergic Reaction Presenting symptoms: rash     Past Medical History  Diagnosis Date  . Pneumonia     2013   Past Surgical History  Procedure Laterality Date  . Hand tendon surgery     Family History  Problem Relation Age of Onset  . Diabetes Mother     ?  Borderline diabetes  . Hypertension Mother   . Diabetes Maternal Uncle   . Thyroid disease Neg Hx    Social History  Substance Use Topics  . Smoking status: Passive Smoke Exposure - Never Smoker  . Smokeless tobacco: Never Used  . Alcohol Use: No    Review of Systems  Gastrointestinal: Positive for nausea and abdominal pain.  Skin: Positive for rash.  All other systems reviewed and are negative.     Allergies  Review of patient's allergies indicates no known allergies.  Home Medications   Prior to Admission medications   Medication Sig Start Date End Date Taking? Authorizing Provider  cimetidine (TAGAMET) 800 MG tablet Take 1 tablet (800 mg total) by mouth at bedtime. 11/21/14    Carmelina Dane, MD  diphenhydrAMINE (BENYLIN) 12.5 MG/5ML syrup Take 10 mLs (25 mg total) by mouth every 6 (six) hours. X 3 days then PRN itching, allergies 11/22/14   Trixie Dredge, PA-C  famotidine (PEPCID) 40 MG/5ML suspension Take 2.5 mLs (20 mg total) by mouth daily. X 3 days then PRN allergic reaction 11/22/14   Trixie Dredge, PA-C  hydrOXYzine (ATARAX/VISTARIL) 25 MG tablet Take 1 tablet (25 mg total) by mouth every 6 (six) hours as needed for itching. 11/21/14   Carmelina Dane, MD  ibuprofen (CHILD IBUPROFEN) 100 MG/5ML suspension Take 30 mLs (600 mg total) by mouth every 6 (six) hours as needed for mild pain or moderate pain. 11/22/14   Trixie Dredge, PA-C  levothyroxine (SYNTHROID, LEVOTHROID) 50 MCG tablet Take 1 tablet (50 mcg total) by mouth daily. Patient not taking: Reported on 11/14/2014 07/16/14   Collie Siad English, PA  ondansetron (ZOFRAN ODT) 4 MG disintegrating tablet 4mg  ODT q4 hours prn nausea/vomit 11/22/14   Trixie Dredge, PA-C  prednisoLONE (PRELONE) 15 MG/5ML SOLN Take 20 mLs (60 mg total) by mouth daily before breakfast. X 5 days 11/22/14 11/27/14  Trixie Dredge, PA-C  predniSONE (STERAPRED UNI-PAK 48 TAB) 10 MG (48) TBPK tablet TAKE AS DIRECTED ON PACKAGE Patient not taking: Reported on 11/22/2014 11/21/14   Carmelina Dane, MD   BP 122/76 mmHg  Pulse 86  Temp(Src) 98.4 F (36.9 C) (Oral)  Resp 19  SpO2 96%  Physical Exam  Constitutional: He is oriented to person, place, and time. He appears well-developed and well-nourished. No distress.  HENT:  Head: Normocephalic and atraumatic.  Right Ear: Hearing normal.  Left Ear: Hearing normal.  Nose: Nose normal.  Mouth/Throat: Oropharynx is clear and moist and mucous membranes are normal.  Eyes: Conjunctivae and EOM are normal. Pupils are equal, round, and reactive to light.  Neck: Normal range of motion. Neck supple.  Cardiovascular: Regular rhythm, S1 normal and S2 normal.  Exam reveals no gallop and no friction rub.   No murmur  heard. Pulmonary/Chest: Effort normal and breath sounds normal. No respiratory distress. He exhibits no tenderness.  Abdominal: Soft. Normal appearance and bowel sounds are normal. There is no hepatosplenomegaly. There is no tenderness. There is no rebound, no guarding, no tenderness at McBurney's point and negative Murphy's sign. No hernia.  Musculoskeletal: Normal range of motion.  Neurological: He is alert and oriented to person, place, and time. He has normal strength. No cranial nerve deficit or sensory deficit. Coordination normal. GCS eye subscore is 4. GCS verbal subscore is 5. GCS motor subscore is 6.  Skin: Skin is warm, dry and intact. No rash noted. No cyanosis.  Diffuse urticaria  Psychiatric: He has a normal mood and affect. His speech is normal and behavior is normal. Thought content normal.  Nursing note and vitals reviewed.   ED Course  Procedures (including critical care time) Labs Review Labs Reviewed  CBC WITH DIFFERENTIAL/PLATELET - Abnormal; Notable for the following:    WBC 17.8 (*)    Neutrophils Relative % 88 (*)    Neutro Abs 15.7 (*)    Lymphocytes Relative 7 (*)    All other components within normal limits  COMPREHENSIVE METABOLIC PANEL - Abnormal; Notable for the following:    Glucose, Bld 160 (*)    Calcium 8.6 (*)    Albumin 3.4 (*)    All other components within normal limits  URINALYSIS, ROUTINE W REFLEX MICROSCOPIC (NOT AT [facility]) - Abnormal; Notable for the following:    Specific Gravity, Urine 1.001 (*)    All other components within normal limits  LIPASE, BLOOD - Abnormal; Notable for the following:    Lipase 18 (*)    All other components within normal limits  I-STAT CG4 LACTIC ACID, ED - Abnormal; Notable for the following:    Lactic Acid, Venous 3.12 (*)    All other components within normal limits  TROPONIN I    Imaging Review Ct Angio Abdomen W/cm &/or Wo Contrast  11/23/2014   CLINICAL DATA:  Chest pain.  Abdominal pain and nausea.   EXAM: CT ANGIOGRAPHY CHEST AND ABDOMEN  TECHNIQUE: Multidetector CT imaging of the chest and abdomen was performed using the standard protocol during bolus administration of intravenous contrast. Multiplanar CT image reconstructions and MIPs were obtained to evaluate the vascular anatomy.  CONTRAST:  OMNIPAQUE IOHEXOL 350 MG/ML SOLN  COMPARISON:  None.  FINDINGS: CTA CHEST FINDINGS  There is adequate opacification of the pulmonary arteries. There is no pulmonary embolus. The main pulmonary artery, right main pulmonary artery and left main pulmonary arteries are normal in size. The heart size is normal. There is no pericardial effusion. The thoracic aorta is normal in caliber. There is no thoracic aortic dissection the of major branch vessels arising from the aortic arch are patent without focal stenosis. There is no para-aortic hematoma.  The lungs are clear. There is no focal consolidation, pleural effusion or pneumothorax.  There  is no axillary, hilar, or mediastinal adenopathy.  There is no lytic or blastic osseous lesion. There is no acute osseous abnormality.  Review of the MIP images confirms the above findings.  CTA ABDOMEN FINDINGS  The liver demonstrates no focal abnormality. There is no intrahepatic or extrahepatic biliary ductal dilatation. The gallbladder is normal. The spleen demonstrates no focal abnormality. The kidneys, adrenal glands and pancreas are normal.  The visualized portions of the stomach, duodenum, small intestine, and large intestine demonstrate no wall thickening or dilatation. There is no pneumoperitoneum, pneumatosis, or portal venous gas. There is no abdominal free fluid. There is no lymphadenopathy.  The abdominal aorta is normal in caliber . There is no abdominal aortic dissection. The celiac artery, SMA, IMA and bilateral renal artery is are patent without focal stenosis. Proximal bilateral common iliac arteries are normal.  There are no lytic or sclerotic osseous lesions.  There is no acute osseous abnormality.  Review of the MIP images confirms the above findings.  IMPRESSION: 1. No aortic aneurysm or dissection. 2. No evidence of pulmonary embolus. 3. No acute abdominal findings.   Electronically Signed   By: Elige Ko   On: 11/23/2014 19:33   Dg Abd Acute W/chest  11/22/2014   CLINICAL DATA:  Abdominal pain and constipation for 5 days.  EXAM: DG ABDOMEN ACUTE W/ 1V CHEST  COMPARISON:  09/24/2010 chest radiograph  FINDINGS: The cardiomediastinal silhouette is unremarkable.  There is no evidence of airspace disease, pleural effusion or pneumothorax.  There is no evidence of bowel obstruction or pneumoperitoneum.  No suspicious calcifications are identified.  No acute bony abnormalities are identified.  IMPRESSION: Negative abdominal radiographs.  No acute cardiopulmonary disease.   Electronically Signed   By: Harmon Pier M.D.   On: 11/22/2014 18:40   Ct Angio Chest Aorta W/cm &/or Wo/cm  11/23/2014   CLINICAL DATA:  Chest pain.  Abdominal pain and nausea.  EXAM: CT ANGIOGRAPHY CHEST AND ABDOMEN  TECHNIQUE: Multidetector CT imaging of the chest and abdomen was performed using the standard protocol during bolus administration of intravenous contrast. Multiplanar CT image reconstructions and MIPs were obtained to evaluate the vascular anatomy.  CONTRAST:  OMNIPAQUE IOHEXOL 350 MG/ML SOLN  COMPARISON:  None.  FINDINGS: CTA CHEST FINDINGS  There is adequate opacification of the pulmonary arteries. There is no pulmonary embolus. The main pulmonary artery, right main pulmonary artery and left main pulmonary arteries are normal in size. The heart size is normal. There is no pericardial effusion. The thoracic aorta is normal in caliber. There is no thoracic aortic dissection the of major branch vessels arising from the aortic arch are patent without focal stenosis. There is no para-aortic hematoma.  The lungs are clear. There is no focal consolidation, pleural effusion or  pneumothorax.  There is no axillary, hilar, or mediastinal adenopathy.  There is no lytic or blastic osseous lesion. There is no acute osseous abnormality.  Review of the MIP images confirms the above findings.  CTA ABDOMEN FINDINGS  The liver demonstrates no focal abnormality. There is no intrahepatic or extrahepatic biliary ductal dilatation. The gallbladder is normal. The spleen demonstrates no focal abnormality. The kidneys, adrenal glands and pancreas are normal.  The visualized portions of the stomach, duodenum, small intestine, and large intestine demonstrate no wall thickening or dilatation. There is no pneumoperitoneum, pneumatosis, or portal venous gas. There is no abdominal free fluid. There is no lymphadenopathy.  The abdominal aorta is normal in caliber . There is  no abdominal aortic dissection. The celiac artery, SMA, IMA and bilateral renal artery is are patent without focal stenosis. Proximal bilateral common iliac arteries are normal.  There are no lytic or sclerotic osseous lesions. There is no acute osseous abnormality.  Review of the MIP images confirms the above findings.  IMPRESSION: 1. No aortic aneurysm or dissection. 2. No evidence of pulmonary embolus. 3. No acute abdominal findings.   Electronically Signed   By: Elige Ko   On: 11/23/2014 19:33   I have personally reviewed and evaluated these images and lab results as part of my medical decision-making.   EKG Interpretation   Date/Time:  Sunday November 23 2014 17:17:39 EDT Ventricular Rate:  85 PR Interval:  160 QRS Duration: 96 QT Interval:  384 QTC Calculation: 457 R Axis:   76 Text Interpretation:  Sinus rhythm Normal ECG Confirmed by Annalycia Done  MD,  Nekeya Briski [phone]) on 11/23/2014 5:22:46 PM      MDM   Final diagnoses:  Chest pain  Hives  Allergic reaction, subsequent encounter    Patient presents to the ER for evaluation of continued rash with abdominal and chest discomfort. Patient seen yesterday and  treated for allergic reaction. Patient reports that he felt better after treatment, but then again the rash has come out today and is worse. Patient experiencing burning discomfort in the epigastric area radiating up into the chest. Patient's lab work is unremarkable. Cardiac evaluation negative. CT angiography performed of chest abdomen and pelvis. No aortic abnormality. No signs of PE. No acute abdominal pathology. Symptoms likely secondary to allergic reaction. Patient treated with Solu-Medrol, Benadryl and the ER. Rash is improving but not completely resolved. Patient monitored for extended period of time. He is felt appropriate for outpatient treatment. He will complete the previously prescribed prednisolone and Benadryl, follow-up with allergist as an outpatient. Return if symptoms worsen.    Gilda Crease, MD 11/23/14 2109

## 2014-11-23 NOTE — Discharge Instructions (Signed)
Hives Hives are itchy, red, swollen areas of the skin. They can vary in size and location on your body. Hives can come and go for hours or several days (acute hives) or for several weeks (chronic hives). Hives do not spread from person to person (noncontagious). They may get worse with scratching, exercise, and emotional stress. CAUSES   Allergic reaction to food, additives, or drugs.  Infections, including the common cold.  Illness, such as vasculitis, lupus, or thyroid disease.  Exposure to sunlight, heat, or cold.  Exercise.  Stress.  Contact with chemicals. SYMPTOMS   Red or white swollen patches on the skin. The patches may change size, shape, and location quickly and repeatedly.  Itching.  Swelling of the hands, feet, and face. This may occur if hives develop deeper in the skin. DIAGNOSIS  Your caregiver can usually tell what is wrong by performing a physical exam. Skin or blood tests may also be done to determine the cause of your hives. In some cases, the cause cannot be determined. TREATMENT  Mild cases usually get better with medicines such as antihistamines. Severe cases may require an emergency epinephrine injection. If the cause of your hives is known, treatment includes avoiding that trigger.  HOME CARE INSTRUCTIONS   Avoid causes that trigger your hives.  Take antihistamines as directed by your caregiver to reduce the severity of your hives. Non-sedating or low-sedating antihistamines are usually recommended. Do not drive while taking an antihistamine.  Take any other medicines prescribed for itching as directed by your caregiver.  Wear loose-fitting clothing.  Keep all follow-up appointments as directed by your caregiver. SEEK MEDICAL CARE IF:   You have persistent or severe itching that is not relieved with medicine.  You have painful or swollen joints. SEEK IMMEDIATE MEDICAL CARE IF:   You have a fever.  Your tongue or lips are swollen.  You have  trouble breathing or swallowing.  You feel tightness in the throat or chest.  You have abdominal pain. These problems may be the first sign of a life-threatening allergic reaction. Call your local emergency services (911 in [place] MAKE SURE YOU:   Understand these instructions.  Will watch your condition.  Will get help right away if you are not doing well or get worse. Document Released: 03/07/2005 Document Revised: 03/12/2013 Document Reviewed: 05/31/2011 [employer] Patient Information 2015 [employer]. This information is not intended to replace advice given to you by your health care provider. Make sure you discuss any questions you have with your health care provider.  

## 2014-12-19 ENCOUNTER — Other Ambulatory Visit (INDEPENDENT_AMBULATORY_CARE_PROVIDER_SITE_OTHER): Payer: BLUE CROSS/BLUE SHIELD

## 2014-12-19 ENCOUNTER — Other Ambulatory Visit: Payer: Self-pay | Admitting: *Deleted

## 2014-12-19 ENCOUNTER — Other Ambulatory Visit: Payer: Self-pay | Admitting: Endocrinology

## 2014-12-19 DIAGNOSIS — E063 Autoimmune thyroiditis: Secondary | ICD-10-CM

## 2014-12-19 DIAGNOSIS — E291 Testicular hypofunction: Secondary | ICD-10-CM

## 2014-12-19 DIAGNOSIS — E038 Other specified hypothyroidism: Secondary | ICD-10-CM

## 2014-12-19 LAB — T4, FREE: Free T4: 0.61 ng/dL (ref 0.60–1.60)

## 2014-12-19 LAB — TESTOSTERONE: TESTOSTERONE: 251.27 ng/dL — AB (ref 300.00–890.00)

## 2014-12-19 LAB — TSH: TSH: 6.18 u[IU]/mL — ABNORMAL HIGH (ref 0.35–4.50)

## 2014-12-26 ENCOUNTER — Encounter: Payer: Self-pay | Admitting: Endocrinology

## 2014-12-26 ENCOUNTER — Ambulatory Visit: Payer: BLUE CROSS/BLUE SHIELD | Admitting: Allergy and Immunology

## 2014-12-26 ENCOUNTER — Ambulatory Visit (INDEPENDENT_AMBULATORY_CARE_PROVIDER_SITE_OTHER): Payer: BLUE CROSS/BLUE SHIELD | Admitting: Endocrinology

## 2014-12-26 VITALS — BP 126/82 | HR 71 | Temp 98.3°F | Resp 14 | Ht 70.0 in | Wt 209.0 lb

## 2014-12-26 DIAGNOSIS — E291 Testicular hypofunction: Secondary | ICD-10-CM

## 2014-12-26 DIAGNOSIS — R7989 Other specified abnormal findings of blood chemistry: Secondary | ICD-10-CM

## 2014-12-26 DIAGNOSIS — E063 Autoimmune thyroiditis: Secondary | ICD-10-CM

## 2014-12-26 DIAGNOSIS — E038 Other specified hypothyroidism: Secondary | ICD-10-CM

## 2014-12-26 MED ORDER — LEVOTHYROXINE SODIUM 25 MCG PO TABS: 25.0000 ug | ORAL_TABLET | Freq: Every day | ORAL | Status: AC

## 2014-12-26 NOTE — Patient Instructions (Addendum)
Take 1/2 thyroid pill daily

## 2014-12-26 NOTE — Progress Notes (Signed)
Patient ID: Juan Richardson, male   DOB: 11/01/1970, 44 y.o.   MRN: [id number]            Reason for Appointment:      History of Present Illness:   The Hypothyroidism was first diagnosed in 4/16   The symptoms consistent with hypothyroidism are: fatigue for about 1 year and some dry skin He was however having routine lab work done by his PCP in 4/16 when his TSH was found to be high at 10.5 He did not have any symptoms of cold sensitivity, difficulty concentrating or weight gain       There is no family history of thyroid disease      He was given a trial of 50 g levothyroxine but did not continue it after a week because of abdominal discomfort and dizzy feeling Since he was not concerned about his fatigue as much on the last visit his medication was not restarted  His free T4 level was lower than expected and TSH was only borderline high on the last visit and pituitary evaluation was done Currently is free T4 is back in the normal range but TSH is 6.2     Lab Results  Component Value Date   FREET4 0.61 12/19/2014   FREET4 0.48* 11/14/2014   FREET4 0.61* 07/25/2014   TSH 6.18* 12/19/2014   TSH 4.74* 11/14/2014   TSH 9.617* 07/25/2014    ENDOCRINE: He has had difficulty with decreased libido but no erectile dysfunction Overall does not feel any significant fatigue, lightheadedness or nausea He says that he has had headaches last year which were more frequent and severe on both sides of the head but recently this has occurred only about every 2 or 3 weeks and is getting better  His free testosterone was low, prolactin low normal but prolactin not completed because of inadequate specimen  Appointment on 12/19/2014  Component Date Value Ref Range Status  . Testosterone 12/19/2014 251.27* 300.00 - 890.00 ng/dL Final  . Free T4 12/19/2014 0.61  0.60 - 1.60 ng/dL Final  . TSH 12/19/2014 6.18* 0.35 - 4.50 uIU/mL Final  Admission on 11/23/2014, Discharged on 11/23/2014  Component  Date Value Ref Range Status  . WBC 11/23/2014 17.8* 4.0 - 10.5 K/uL Final  . RBC 11/23/2014 4.63  4.22 - 5.81 MIL/uL Final  . Hemoglobin 11/23/2014 14.0  13.0 - 17.0 g/dL Final  . HCT 11/23/2014 40.4  39.0 - 52.0 % Final  . MCV 11/23/2014 87.3  78.0 - 100.0 fL Final  . MCH 11/23/2014 30.2  26.0 - 34.0 pg Final  . MCHC 11/23/2014 34.7  30.0 - 36.0 g/dL Final  . RDW 11/23/2014 12.7  11.5 - 15.5 % Final  . Platelets 11/23/2014 213  150 - 400 K/uL Final  . Neutrophils Relative % 11/23/2014 88* 43 - 77 % Final  . Neutro Abs 11/23/2014 15.7* 1.7 - 7.7 K/uL Final  . Lymphocytes Relative 11/23/2014 7* 12 - 46 % Final  . Lymphs Abs 11/23/2014 1.2  0.7 - 4.0 K/uL Final  . Monocytes Relative 11/23/2014 5  3 - 12 % Final  . Monocytes Absolute 11/23/2014 0.9  0.1 - 1.0 K/uL Final  . Eosinophils Relative 11/23/2014 0  0 - 5 % Final  . Eosinophils Absolute 11/23/2014 0.0  0.0 - 0.7 K/uL Final  . Basophils Relative 11/23/2014 0  0 - 1 % Final  . Basophils Absolute 11/23/2014 0.0  0.0 - 0.1 K/uL Final  . Sodium 11/23/2014 137  135 - 145 mmol/L Final  . Potassium 11/23/2014 4.2  3.5 - 5.1 mmol/L Final  . Chloride 11/23/2014 105  101 - 111 mmol/L Final  . CO2 11/23/2014 23  22 - 32 mmol/L Final  . Glucose, Bld 11/23/2014 160* 65 - 99 mg/dL Final  . BUN 11/23/2014 13  6 - 20 mg/dL Final  . Creatinine, Ser 11/23/2014 1.01  0.61 - 1.24 mg/dL Final  . Calcium 11/23/2014 8.6* 8.9 - 10.3 mg/dL Final  . Total Protein 11/23/2014 6.5  6.5 - 8.1 g/dL Final  . Albumin 11/23/2014 3.4* 3.5 - 5.0 g/dL Final  . AST 11/23/2014 36  15 - 41 U/L Final  . ALT 11/23/2014 28  17 - 63 U/L Final  . Alkaline Phosphatase 11/23/2014 43  38 - 126 U/L Final  . Total Bilirubin 11/23/2014 0.6  0.3 - 1.2 mg/dL Final  . GFR calc non Af Amer 11/23/2014 >60  >60 mL/min Final  . GFR calc Af Amer 11/23/2014 >60  >60 mL/min Final   Comment: (NOTE) The eGFR has been calculated using the CKD EPI equation. This calculation has not been  validated in all clinical situations. eGFR's persistently <60 mL/min signify possible Chronic Kidney Disease.   . Anion gap 11/23/2014 9  5 - 15 Final  . Color, Urine 11/23/2014 YELLOW  YELLOW Final  . APPearance 11/23/2014 CLEAR  CLEAR Final  . Specific Gravity, Urine 11/23/2014 1.001* 1.005 - 1.030 Final  . pH 11/23/2014 7.0  5.0 - 8.0 Final  . Glucose, UA 11/23/2014 NEGATIVE  NEGATIVE mg/dL Final  . Hgb urine dipstick 11/23/2014 NEGATIVE  NEGATIVE Final  . Bilirubin Urine 11/23/2014 NEGATIVE  NEGATIVE Final  . Ketones, ur 11/23/2014 NEGATIVE  NEGATIVE mg/dL Final  . Protein, ur 11/23/2014 NEGATIVE  NEGATIVE mg/dL Final  . Urobilinogen, UA 11/23/2014 0.2  0.0 - 1.0 mg/dL Final  . Nitrite 11/23/2014 NEGATIVE  NEGATIVE Final  . Leukocytes, UA 11/23/2014 NEGATIVE  NEGATIVE Final   MICROSCOPIC NOT DONE ON URINES WITH NEGATIVE PROTEIN, BLOOD, LEUKOCYTES, NITRITE, OR GLUCOSE <1000 mg/dL.  . Lipase 11/23/2014 18* 22 - 51 U/L Final  . Troponin I 11/23/2014 <0.03  <0.031 ng/mL Final   Comment:        NO INDICATION OF MYOCARDIAL INJURY.   . Lactic Acid, Venous 11/23/2014 3.12* 0.5 - 2.0 mmol/L Final  . Comment 11/23/2014 NOTIFIED PHYSICIAN   Final  Admission on 11/22/2014, Discharged on 11/22/2014  Component Date Value Ref Range Status  . Sodium 11/22/2014 134* 135 - 145 mmol/L Final  . Potassium 11/22/2014 3.7  3.5 - 5.1 mmol/L Final  . Chloride 11/22/2014 102  101 - 111 mmol/L Final  . CO2 11/22/2014 25  22 - 32 mmol/L Final  . Glucose, Bld 11/22/2014 135* 65 - 99 mg/dL Final  . BUN 11/22/2014 8  6 - 20 mg/dL Final  . Creatinine, Ser 11/22/2014 1.22  0.61 - 1.24 mg/dL Final  . Calcium 11/22/2014 8.7* 8.9 - 10.3 mg/dL Final  . Total Protein 11/22/2014 6.2* 6.5 - 8.1 g/dL Final  . Albumin 11/22/2014 3.6  3.5 - 5.0 g/dL Final  . AST 11/22/2014 22  15 - 41 U/L Final  . ALT 11/22/2014 24  17 - 63 U/L Final  . Alkaline Phosphatase 11/22/2014 43  38 - 126 U/L Final  . Total Bilirubin  11/22/2014 1.4* 0.3 - 1.2 mg/dL Final  . GFR calc non Af Amer 11/22/2014 >60  >60 mL/min Final  . GFR calc Af Amer 11/22/2014 >  60  >60 mL/min Final   Comment: (NOTE) The eGFR has been calculated using the CKD EPI equation. This calculation has not been validated in all clinical situations. eGFR's persistently <60 mL/min signify possible Chronic Kidney Disease.   . Anion gap 11/22/2014 7  5 - 15 Final  . WBC 11/22/2014 13.9* 4.0 - 10.5 K/uL Final  . RBC 11/22/2014 5.16  4.22 - 5.81 MIL/uL Final  . Hemoglobin 11/22/2014 15.7  13.0 - 17.0 g/dL Final  . HCT 11/22/2014 44.8  39.0 - 52.0 % Final  . MCV 11/22/2014 86.8  78.0 - 100.0 fL Final  . MCH 11/22/2014 30.4  26.0 - 34.0 pg Final  . MCHC 11/22/2014 35.0  30.0 - 36.0 g/dL Final  . RDW 11/22/2014 12.5  11.5 - 15.5 % Final  . Platelets 11/22/2014 193  150 - 400 K/uL Final  . Color, Urine 11/22/2014 YELLOW  YELLOW Final  . APPearance 11/22/2014 CLEAR  CLEAR Final  . Specific Gravity, Urine 11/22/2014 1.008  1.005 - 1.030 Final  . pH 11/22/2014 6.5  5.0 - 8.0 Final  . Glucose, UA 11/22/2014 NEGATIVE  NEGATIVE mg/dL Final  . Hgb urine dipstick 11/22/2014 TRACE* NEGATIVE Final  . Bilirubin Urine 11/22/2014 NEGATIVE  NEGATIVE Final  . Ketones, ur 11/22/2014 NEGATIVE  NEGATIVE mg/dL Final  . Protein, ur 11/22/2014 NEGATIVE  NEGATIVE mg/dL Final  . Urobilinogen, UA 11/22/2014 0.2  0.0 - 1.0 mg/dL Final  . Nitrite 11/22/2014 NEGATIVE  NEGATIVE Final  . Leukocytes, UA 11/22/2014 NEGATIVE  NEGATIVE Final  . Lipase 11/22/2014 14* 22 - 51 U/L Final  . Squamous Epithelial / LPF 11/22/2014 RARE  RARE Final  . WBC, UA 11/22/2014 0-2  <3 WBC/hpf Final  . RBC / HPF 11/22/2014 0-2  <3 RBC/hpf Final  Office Visit on 11/14/2014  Component Date Value Ref Range Status  . Free T4 11/14/2014 0.48* 0.60 - 1.60 ng/dL Final  . TSH 11/14/2014 4.74* 0.35 - 4.50 uIU/mL Final  . Testosterone 11/14/2014 157* 348 - 1197 ng/dL Final  . Comment, Testosterone  11/14/2014 Comment   Final   Comment: Adult male reference interval is based on a population of lean males up to 45 years old.   . Testosterone, Free 11/14/2014 4.9* 6.8 - 21.5 pg/mL Final  . Sex Hormone Binding 11/14/2014 18.8  16.5 - 55.9 nmol/L Final  . specimen status report 11/14/2014 Comment   Final   Comment: Verbal Order See below: Comment: Please provide requested information and fax to [phone] or [phone]. The [place] Code of [employer] requires a written and signed request be forwarded to a laboratory following a verbal order of a laboratory test.  Please assist [place] to meet this requirement and to complete our records. Date:______________________________ ICD-9/10 Diagnosis Code(s):___________________________________________ Physician or Authorized Designee:_____________________________________                                               Please Print Physician or Authorized Designee Signature: ______________________________________________________________________ Your Animal nutritionist Your Order Of The Test(s) Listed Additional Test(s) Requested Comment: Test(s) added per RHONDA P at account 11-18-2014 Logged by Jonell Cluck Test# 110315 Luteinizing Hormone(LH), S Test# [id number] Prolactin Test# [id number] IGF-1 Diagnosis Codes Provided    E23.  0      R68.82   . [place] 11/14/2014 3.0  1.7 - 8.6 mIU/mL Final  . Prolactin 11/14/2014 CANCELED   Final-Edited   Comment: Quantity was not sufficient for add-on test.  Result canceled by the ancillary   . Insulin-Like GF-1 11/14/2014 114  75 - 216 ng/mL Final  . specimen status report 11/14/2014 Comment   Final   Comment: Written Authorization Written Authorization Written Authorization Received. Authorization received from [place] 11-19-2014 Logged by Joellyn Quails        Past Medical History  Diagnosis Date  . Pneumonia     2013    Past Surgical History    Procedure Laterality Date  . Hand tendon surgery      Family History  Problem Relation Age of Onset  . Diabetes Mother     ?  Borderline diabetes  . Hypertension Mother   . Diabetes Maternal Uncle   . Thyroid disease Neg Hx     Social History:  reports that he has been passively smoking.  He has never used smokeless tobacco. He reports that he does not drink alcohol or use illicit drugs.  Allergies: No Known Allergies    Medication List       This list is accurate as of: 12/26/14  2:45 PM.  Always use your most recent med list.               cimetidine 800 MG tablet  Commonly known as:  TAGAMET  Take 1 tablet (800 mg total) by mouth at bedtime.     diphenhydrAMINE 12.5 MG/5ML syrup  Commonly known as:  BENYLIN  Take 10 mLs (25 mg total) by mouth every 6 (six) hours. X 3 days then PRN itching, allergies     famotidine 40 MG/5ML suspension  Commonly known as:  PEPCID  Take 2.5 mLs (20 mg total) by mouth daily. X 3 days then PRN allergic reaction     hydrOXYzine 25 MG tablet  Commonly known as:  ATARAX/VISTARIL  Take 1 tablet (25 mg total) by mouth every 6 (six) hours as needed for itching.     ibuprofen 100 MG/5ML suspension  Commonly known as:  CHILD IBUPROFEN  Take 30 mLs (600 mg total) by mouth every 6 (six) hours as needed for mild pain or moderate pain.     levothyroxine 50 MCG tablet  Commonly known as:  SYNTHROID, LEVOTHROID  Take 1 tablet (50 mcg total) by mouth daily.     ondansetron 4 MG disintegrating tablet  Commonly known as:  ZOFRAN ODT  49m ODT q4 hours prn nausea/vomit     predniSONE 10 MG (48) Tbpk tablet  Commonly known as:  STERAPRED UNI-PAK 48 TAB  TAKE AS DIRECTED ON PACKAGE        Review of Systems:  Review of Systems  HENT:       Less now  Neurological: Positive for headaches.       He has had problems with hives treated in the ER with steroids but has not had any for the last 2 or 3 weeks Asking about whether he should see  the allergist   Examination:    BP 126/82 mmHg  Pulse 71  Temp(Src) 98.3 F (36.8 C)  Resp 14  Ht 5' 10"  (1.778 m)  Wt 209 lb (94.802 kg)  BMI 29.99 kg/m2  SpO2 98%  THYROID:  Enlarged 2-2.5 times normal on the right side, smooth, firm and rubbery, left side about  1.5-2 times normal, smooth.   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  He can take this in the afternoon if he has abdominal discomfort but taking it in the morning Check TPO antibody to confirm autoimmune disease Recheck morning testosterone on the next visit Check prolactin level  Will consider MRI of pituitary gland if he is having worsening headaches, high prolactin level or significantly low testosterone on the next visit  Juan Richardson 12/26/2014, 2:45 PM

## 2015-01-16 ENCOUNTER — Ambulatory Visit (INDEPENDENT_AMBULATORY_CARE_PROVIDER_SITE_OTHER): Payer: BLUE CROSS/BLUE SHIELD | Admitting: Allergy and Immunology

## 2015-01-16 ENCOUNTER — Encounter: Payer: Self-pay | Admitting: Allergy and Immunology

## 2015-01-16 VITALS — BP 126/74 | HR 88 | Temp 98.1°F | Resp 16 | Ht 70.08 in | Wt 211.4 lb

## 2015-01-16 DIAGNOSIS — J310 Chronic rhinitis: Secondary | ICD-10-CM

## 2015-01-16 DIAGNOSIS — L509 Urticaria, unspecified: Secondary | ICD-10-CM | POA: Diagnosis not present

## 2015-01-18 NOTE — Progress Notes (Signed)
FOLLOW UP NOTE  RE: Juan Richardson MRN: [id number] DOB: 1970-06-17 [facility]  [address] [place] [address] Date of Office Visit: 01/16/2015  Subjective:  Juan Richardson is a 44 y.o. male who presents today for Allergy Testing   HPI: Juan Richardson returns to the office off antihistamines for completion of aeroallergen/food sensitivity evaluation.  He reports no further episodes of hives/rashes or other skin concerns.  He reports feeling well.  He typically eats various fish dishes. No other new concerns. No current restrictions to his diet or other exposures.  Current Medications: 1.  Levothyroxine daily.  Drug Allergies: No Known Allergies  Objective:   Filed Vitals:   01/16/15 1054  BP: 126/74  Pulse: 88  Temp: 98.1 F (36.7 C)  Resp: 16   Physical Exam  Constitutional: He is well-developed, well-nourished, and in no distress.  HENT:  Head: Atraumatic.  Right Ear: Tympanic membrane and ear canal normal.  Left Ear: Tympanic membrane and ear canal normal.  Nose: Mucosal edema present. No rhinorrhea. No epistaxis.  Mouth/Throat: Oropharynx is clear and moist and mucous membranes are normal. No oropharyngeal exudate, posterior oropharyngeal edema or posterior oropharyngeal erythema.  Eyes: Conjunctivae are normal.  Neck: Neck supple.  Cardiovascular: Normal rate, S1 normal and S2 normal.   No murmur heard. Pulmonary/Chest: Effort normal and breath sounds normal. He has no wheezes. He has no rhonchi. He has no rales.  Lymphadenopathy:    He has no cervical adenopathy.  Skin: Skin is warm and intact. No rash noted. No cyanosis. Nails show no clubbing.    Assessment:   1. Chronic rhinitis   2. Hives   3.      Patient declined allergy testing. Plan:  1.  Shaft prefers to hold off on any testing today, though have reviewed with him my recommendations for testing today. 2.  He will monitor closely  any for new episodes of hives or skin changes and document environment, exposure, ingestion, activity and take picture. 3.  Follow-up as discussed.    Rutger Salton M. Willa RoughHicks, MD  cc: Arlyce DiceStepahnie English, PA

## 2015-01-30 ENCOUNTER — Other Ambulatory Visit (INDEPENDENT_AMBULATORY_CARE_PROVIDER_SITE_OTHER): Payer: BLUE CROSS/BLUE SHIELD

## 2015-01-30 DIAGNOSIS — E038 Other specified hypothyroidism: Secondary | ICD-10-CM | POA: Diagnosis not present

## 2015-01-30 DIAGNOSIS — E291 Testicular hypofunction: Secondary | ICD-10-CM

## 2015-01-30 DIAGNOSIS — R7989 Other specified abnormal findings of blood chemistry: Secondary | ICD-10-CM

## 2015-01-30 DIAGNOSIS — E063 Autoimmune thyroiditis: Secondary | ICD-10-CM

## 2015-01-30 LAB — TSH: TSH: 2.3 u[IU]/mL (ref 0.35–4.50)

## 2015-01-30 LAB — T4, FREE: Free T4: 0.51 ng/dL — ABNORMAL LOW (ref 0.60–1.60)

## 2015-01-30 LAB — TESTOSTERONE: Testosterone: 214.93 ng/dL — ABNORMAL LOW (ref 300.00–890.00)

## 2015-01-31 LAB — THYROID PEROXIDASE ANTIBODY: Thyroperoxidase Ab SerPl-aCnc: 65 IU/mL — ABNORMAL HIGH (ref 0–34)

## 2015-01-31 LAB — PROLACTIN: PROLACTIN: 4.8 ng/mL (ref 4.0–15.2)

## 2015-02-06 ENCOUNTER — Ambulatory Visit: Payer: BLUE CROSS/BLUE SHIELD | Admitting: Endocrinology

## 2015-02-13 NOTE — Progress Notes (Signed)
Quick Note:  Please reschedule missed appt ______

## 2015-02-18 ENCOUNTER — Telehealth: Payer: Self-pay | Admitting: Endocrinology

## 2015-02-18 NOTE — Telephone Encounter (Signed)
LM for pt to call back to schedule the appt

## 2015-02-18 NOTE — Telephone Encounter (Signed)
-----   Message from Reather LittlerAjay Kumar, MD sent at 02/13/2015  9:25 AM EST ----- Please reschedule missed appt

## 2015-02-20 ENCOUNTER — Ambulatory Visit (INDEPENDENT_AMBULATORY_CARE_PROVIDER_SITE_OTHER): Payer: BLUE CROSS/BLUE SHIELD | Admitting: Endocrinology

## 2015-02-20 ENCOUNTER — Encounter: Payer: Self-pay | Admitting: Endocrinology

## 2015-02-20 VITALS — BP 114/54 | HR 70 | Temp 98.3°F | Resp 14 | Ht 70.0 in | Wt 215.0 lb

## 2015-02-20 DIAGNOSIS — E038 Other specified hypothyroidism: Secondary | ICD-10-CM

## 2015-02-20 DIAGNOSIS — E230 Hypopituitarism: Secondary | ICD-10-CM

## 2015-02-20 DIAGNOSIS — E063 Autoimmune thyroiditis: Secondary | ICD-10-CM

## 2015-02-20 NOTE — Progress Notes (Signed)
Patient ID: Barbaraann FasterKhalid Mccadden, male   DOB: 02/03/1971, 44 y.o.   MRN: [id number]            Reason for Appointment:      History of Present Illness:   The Hypothyroidism was first diagnosed in 4/16   The symptoms consistent with hypothyroidism are: fatigue for about 1 year and some dry skin He was however having routine lab work done by his PCP in 4/16 when his TSH was found to be high at 10.5 He did not have any symptoms of cold sensitivity, difficulty concentrating or weight gain       There is no family history of thyroid disease     He was given a trial of 50 g levothyroxine but did not continue it after a week because of abdominal discomfort and dizzy feeling  Subsequently he has had continued abnormalities of his thyroid functions, free T4 has been as low as 0.48 However he does not think he is concerned about fatigue and feels fairly good Because of continued abnormal thyroid functions and TSH of 6.2 he was told to try 25 g of levothyroxine with food However he took this only a month and stopped it on his own He says it tends to cause abdominal discomfort but was taking this before breakfast instead of after meals Currently is free T4 is still low although TSH is back to normal     Lab Results  Component Value Date   FREET4 0.51* 01/30/2015   FREET4 0.61 12/19/2014   FREET4 0.48* 11/14/2014   TSH 2.30 01/30/2015   TSH 6.18* 12/19/2014   TSH 4.74* 11/14/2014    ENDOCRINE: He has had some difficulty with decreased libido but no erectile dysfunction, today he does not think this is a problem Does not feel has any decreased motivation or fatigue  He says that he has had headaches on and off but now he thinks these are related to work environment Has these periodically, usually a couple of times a week at times  His free testosterone was low on repeat testing, prolactin  normal and also LH low-normal   Appointment on 01/30/2015  Component Date Value Ref Range Status  .  Prolactin 01/30/2015 4.8  4.0 - 15.2 ng/mL Final  . Testosterone 01/30/2015 214.93* 300.00 - 890.00 ng/dL Final  . Thyroperoxidase Ab SerPl-aCnc 01/30/2015 65* 0 - 34 IU/mL Final  . TSH 01/30/2015 2.30  0.35 - 4.50 uIU/mL Final  . Free T4 01/30/2015 0.51* 0.60 - 1.60 ng/dL Final       Past Medical History  Diagnosis Date  . Pneumonia     2013    Past Surgical History  Procedure Laterality Date  . Hand tendon surgery      Family History  Problem Relation Age of Onset  . Diabetes Mother     ?  Borderline diabetes  . Hypertension Mother   . Diabetes Maternal Uncle   . Thyroid disease Neg Hx     Social History:  reports that he quit smoking about 5 years ago. He started smoking about 20 years ago. He has never used smokeless tobacco. He reports that he does not drink alcohol or use illicit drugs.  Allergies: No Known Allergies    Medication List       This list is accurate as of: 02/20/15  5:13 PM.  Always use your most recent med list.               diphenhydrAMINE 12.5  MG/5ML syrup  Commonly known as:  BENYLIN  Take 10 mLs (25 mg total) by mouth every 6 (six) hours. X 3 days then PRN itching, allergies     famotidine 40 MG/5ML suspension  Commonly known as:  PEPCID  Take 2.5 mLs (20 mg total) by mouth daily. X 3 days then PRN allergic reaction     hydrOXYzine 25 MG tablet  Commonly known as:  ATARAX/VISTARIL  Take 1 tablet (25 mg total) by mouth every 6 (six) hours as needed for itching.     ibuprofen 100 MG/5ML suspension  Commonly known as:  ADVIL,MOTRIN  TK 30 MLS PO Q 6 H PRF MILD OR MODERATE PAIN     levothyroxine 25 MCG tablet  Commonly known as:  LEVOTHROID  Take 1 tablet (25 mcg total) by mouth daily before breakfast.        Review of Systems:  Review of Systems  Musculoskeletal: Positive for joint pain.       He is having difficulties with pain in his fingers and hands and some in his lower legs   Examination:    BP 114/54 mmHg   Pulse 70  Temp(Src) 98.3 F (36.8 C)  Resp 14  Ht  (1.778 m)  Wt 215 lb (97.523 kg)  BMI 30.85 kg/m2  SpO2 98%  THYROID:  Enlarged 2-2.5 times normal on the right side, smooth, firm and rubbery, left side about 1.5-2 times normal, smooth.   Biceps reflexes are normal  Assessments  HYPOTHYROIDISM with associated goiter, likely to be from Hashimoto thyroiditis with positive antibodies  Although his TSH is mildly increased it has been fluctuating  Also his free T4 is also low indicating secondary hypothyroidism  He does appear to have mild hypogonadotropic hypogonadism with persistently low free testosterone He has conflicting answers about his symptoms and does not complain of any fatigue or decreased libido now Tends to have insomnia  Headaches: These are intermittent and appear nonspecific  Treatment:    Trial of 25 g levothyroxine with lunch, asked him to continue this long-term  Periodically monitor testosterone level and consider supplementation or clomiphene if symptomatic  Will consider MRI of pituitary gland if he is having worsening headaches, high prolactin level or significantly low testosterone on the next visit  Follow-up with PCP for joint pains .  Orvetta Danielski 02/20/2015, 5:13 PM

## 2015-02-20 NOTE — Patient Instructions (Signed)
Take thyroid pill after lunch

## 2015-05-22 ENCOUNTER — Other Ambulatory Visit: Payer: BLUE CROSS/BLUE SHIELD

## 2015-05-22 ENCOUNTER — Ambulatory Visit: Payer: BLUE CROSS/BLUE SHIELD | Admitting: Endocrinology

## 2015-05-29 ENCOUNTER — Ambulatory Visit: Payer: BLUE CROSS/BLUE SHIELD | Admitting: Endocrinology

## 2015-06-09 ENCOUNTER — Telehealth: Payer: Self-pay | Admitting: Family Medicine

## 2015-06-09 ENCOUNTER — Telehealth: Payer: Self-pay

## 2015-06-09 NOTE — Telephone Encounter (Signed)
Pt called in response to call from [name]  -pt declines flu shot// phone reps. Do not have access to document health maintenance for documentation  Juan Richardson  06/09/2015  Telephone  MRN:  [id number]   Description: 45 year old male  Provider: Lucia GaskinsGayle C Haas, CMA  [facility]          Call Documentation      Lucia GaskinsGayle C Haas, CMA at 06/09/2015 2:39 PM     Status: Signed       Expand All Collapse All   Called patient to see if they have had the flu shot. If they have where and when? If not ask them to come in and get it. If they decline please document it in health maintenance.

## 2015-06-09 NOTE — Telephone Encounter (Signed)
Called patient to see if they have had the flu shot.   If they have where and when?  If not ask them to come in and get it.  If they decline please document it in health maintenance. 

## 2015-12-08 DIAGNOSIS — Z0001 Encounter for general adult medical examination with abnormal findings: Secondary | ICD-10-CM | POA: Diagnosis not present

## 2015-12-08 DIAGNOSIS — Z0000 Encounter for general adult medical examination without abnormal findings: Secondary | ICD-10-CM | POA: Diagnosis not present

## 2015-12-08 DIAGNOSIS — K219 Gastro-esophageal reflux disease without esophagitis: Secondary | ICD-10-CM | POA: Diagnosis not present

## 2015-12-08 DIAGNOSIS — B353 Tinea pedis: Secondary | ICD-10-CM | POA: Diagnosis not present

## 2015-12-08 DIAGNOSIS — R109 Unspecified abdominal pain: Secondary | ICD-10-CM | POA: Diagnosis not present

## 2016-04-09 DIAGNOSIS — L282 Other prurigo: Secondary | ICD-10-CM | POA: Diagnosis not present

## 2016-04-09 DIAGNOSIS — E039 Hypothyroidism, unspecified: Secondary | ICD-10-CM | POA: Diagnosis not present

## 2016-04-29 DIAGNOSIS — K5900 Constipation, unspecified: Secondary | ICD-10-CM | POA: Diagnosis not present

## 2016-04-29 DIAGNOSIS — E039 Hypothyroidism, unspecified: Secondary | ICD-10-CM | POA: Diagnosis not present

## 2016-04-29 DIAGNOSIS — K219 Gastro-esophageal reflux disease without esophagitis: Secondary | ICD-10-CM | POA: Diagnosis not present

## 2016-05-06 DIAGNOSIS — E039 Hypothyroidism, unspecified: Secondary | ICD-10-CM | POA: Diagnosis not present

## 2016-05-10 ENCOUNTER — Other Ambulatory Visit: Payer: Self-pay | Admitting: Medical

## 2016-05-10 DIAGNOSIS — E039 Hypothyroidism, unspecified: Secondary | ICD-10-CM

## 2016-05-13 ENCOUNTER — Ambulatory Visit
Admission: RE | Admit: 2016-05-13 | Discharge: 2016-05-13 | Disposition: A | Discharge status: Home / Self Care | Payer: BLUE CROSS/BLUE SHIELD | Source: Ambulatory Visit | Attending: Medical | Admitting: Medical

## 2016-05-13 DIAGNOSIS — E039 Hypothyroidism, unspecified: Secondary | ICD-10-CM

## 2016-06-14 ENCOUNTER — Ambulatory Visit: Payer: BLUE CROSS/BLUE SHIELD | Admitting: Endocrinology

## 2016-07-01 ENCOUNTER — Ambulatory Visit (INDEPENDENT_AMBULATORY_CARE_PROVIDER_SITE_OTHER): Payer: BLUE CROSS/BLUE SHIELD | Admitting: Urgent Care

## 2016-07-01 VITALS — BP 143/67 | HR 71 | Temp 98.3°F | Resp 16 | Ht 70.0 in | Wt 205.0 lb

## 2016-07-01 DIAGNOSIS — R0982 Postnasal drip: Secondary | ICD-10-CM

## 2016-07-01 DIAGNOSIS — R05 Cough: Secondary | ICD-10-CM

## 2016-07-01 DIAGNOSIS — J029 Acute pharyngitis, unspecified: Secondary | ICD-10-CM

## 2016-07-01 DIAGNOSIS — R059 Cough, unspecified: Secondary | ICD-10-CM

## 2016-07-01 LAB — POCT RAPID STREP A (OFFICE): Rapid Strep A Screen: NEGATIVE

## 2016-07-01 MED ORDER — CETIRIZINE HCL 10 MG PO TABS: 10.0000 mg | ORAL_TABLET | Freq: Every day | ORAL | 11 refills | Status: AC

## 2016-07-01 MED ORDER — BENZONATATE 100 MG PO CAPS: 100.0000 mg | ORAL_CAPSULE | Freq: Three times a day (TID) | ORAL | 0 refills | Status: AC | PRN

## 2016-07-01 MED ORDER — PSEUDOEPHEDRINE HCL ER 120 MG PO TB12: 120.0000 mg | ORAL_TABLET | Freq: Two times a day (BID) | ORAL | 1 refills | Status: AC

## 2016-07-01 NOTE — Patient Instructions (Signed)
Sore Throat A sore throat is pain, burning, irritation, or scratchiness in the throat. When you have a sore throat, you may feel pain or tenderness in your throat when you swallow or talk. Many things can cause a sore throat, including:  An infection.  Seasonal allergies.  Dryness in the air.  Irritants, such as smoke or pollution.  Gastroesophageal reflux disease (GERD).  A tumor. A sore throat is often the first sign of another sickness. It may happen with other symptoms, such as coughing, sneezing, fever, and swollen neck glands. Most sore throats go away without medical treatment. Follow these instructions at home:  Take over-the-counter medicines only as told by your health care provider.  Drink enough fluids to keep your urine clear or pale yellow.  Rest as needed.  To help with pain, try:  Sipping warm liquids, such as broth, herbal tea, or warm water.  Eating or drinking cold or frozen liquids, such as frozen ice pops.  Gargling with a salt-water mixture 3-4 times a day or as needed. To make a salt-water mixture, completely dissolve -1 tsp of salt in 1 cup of warm water.  Sucking on hard candy or throat lozenges.  Putting a cool-mist humidifier in your bedroom at night to moisten the air.  Sitting in the bathroom with the door closed for 5-10 minutes while you run hot water in the shower.  Do not use any tobacco products, such as cigarettes, chewing tobacco, and e-cigarettes. If you need help quitting, ask your health care provider. Contact a health care provider if:  You have a fever for more than 2-3 days.  You have symptoms that last (are persistent) for more than 2-3 days.  Your throat does not get better within 7 days.  You have a fever and your symptoms suddenly get worse. Get help right away if:  You have difficulty breathing.  You cannot swallow fluids, soft foods, or your saliva.  You have increased swelling in your throat or neck.  You have  persistent nausea and vomiting. This information is not intended to replace advice given to you by your health care provider. Make sure you discuss any questions you have with your health care provider. Document Released: 04/14/2004 Document Revised: 11/01/2015 Document Reviewed: 12/26/2014 Elsevier Interactive Patient Education  2017 [employer]  

## 2016-07-01 NOTE — Progress Notes (Signed)
  MRN: [id number] DOB: April 25, 1970  Subjective:   Juan Richardson is a 46 y.o. male presenting for chief complaint of Sore Throat (for 2 weeks, started with fever, cough for a few days and feels like right side is swollen, hurts to swallow)  Reported 2 week history of sore throat. Throat pain is worsening, has difficulty swallowing. Symptoms started out with fever, productive cough (mostly in the morning), fatigue, chills. Still has the cough. Has also had right ear discomfort. Has not tried any medications for relief. Has been using hot tea, ginger with lemon. Denies sinus pain, sinus congestion, chest pain, shob, n/v, abdominal pain, rashes. Denies smoking cigarettes.   Juan Richardson has a current medication list which includes the following prescription(s): diphenhydramine, famotidine, hydroxyzine, ibuprofen, and levothyroxine. Also has No Known Allergies. Juan Richardson  has a past medical history of Pneumonia. Also  has a past surgical history that includes Hand tendon surgery.  Objective:   Vitals: BP (!) 143/67 (BP Location: Right Arm, Patient Position: Sitting, Cuff Size: Small)   Pulse 71   Temp 98.3 F (36.8 C) (Oral)   Resp 16   Ht  (1.778 m)   Wt 205 lb (93 kg)   SpO2 97%   BMI 29.41 kg/m   Physical Exam  Constitutional: He is oriented to person, place, and time. He appears well-developed and well-nourished.  HENT:  TM's intact bilaterally, no effusions or erythema. Nasal turbinates pink and moist, nasal passages patent. No sinus tenderness. Oropharynx with moderate post-nasal drainage, mucous membranes moist, dentition in good repair.  Eyes: Right eye exhibits no discharge. Left eye exhibits no discharge.  Neck: Normal range of motion. Neck supple.  Cardiovascular: Normal rate.   Pulmonary/Chest: Effort normal.  Lymphadenopathy:    He has no cervical adenopathy.  Neurological: He is alert and oriented to person, place, and time.  Skin: Skin is warm and dry.  Psychiatric: He has  a normal mood and affect.    Results for orders placed or performed in visit on 07/01/16 (from the past 24 hour(s))  POCT rapid strep A     Status: None   Collection Time: 07/01/16  3:45 PM  Result Value Ref Range   Rapid Strep A Screen Negative Negative   Assessment and Plan :   1. Sore throat 2. Cough 3. Post-nasal drainage - Strep culture pending, start supportive care and prescribe antibiotics if positive strep culture or no improvement in symptoms in 1 week.  Wallis Bamberg, PA-C Primary [facility] at [facility] [phone] 07/01/2016  3:33 PM

## 2016-07-03 LAB — CULTURE, GROUP A STREP: STREP A CULTURE: NEGATIVE

## 2016-07-07 ENCOUNTER — Encounter: Payer: Self-pay | Admitting: Urgent Care

## 2016-07-08 ENCOUNTER — Ambulatory Visit: Payer: BLUE CROSS/BLUE SHIELD | Admitting: Endocrinology

## 2016-08-26 DIAGNOSIS — E039 Hypothyroidism, unspecified: Secondary | ICD-10-CM | POA: Diagnosis not present

## 2016-10-28 DIAGNOSIS — M5441 Lumbago with sciatica, right side: Secondary | ICD-10-CM | POA: Diagnosis not present

## 2017-02-24 DIAGNOSIS — E040 Nontoxic diffuse goiter: Secondary | ICD-10-CM | POA: Diagnosis not present

## 2017-02-24 DIAGNOSIS — Z0000 Encounter for general adult medical examination without abnormal findings: Secondary | ICD-10-CM | POA: Diagnosis not present

## 2017-02-24 DIAGNOSIS — E039 Hypothyroidism, unspecified: Secondary | ICD-10-CM | POA: Diagnosis not present

## 2017-06-21 ENCOUNTER — Encounter: Payer: Self-pay | Admitting: Physician Assistant

## 2017-11-03 ENCOUNTER — Other Ambulatory Visit: Payer: Self-pay | Admitting: Family Medicine

## 2017-11-03 DIAGNOSIS — E040 Nontoxic diffuse goiter: Secondary | ICD-10-CM

## 2017-11-03 DIAGNOSIS — R760 Raised antibody titer: Secondary | ICD-10-CM | POA: Diagnosis not present

## 2017-11-24 ENCOUNTER — Other Ambulatory Visit: Payer: BLUE CROSS/BLUE SHIELD

## 2017-11-27 DIAGNOSIS — E039 Hypothyroidism, unspecified: Secondary | ICD-10-CM | POA: Diagnosis not present

## 2017-11-27 DIAGNOSIS — Z8639 Personal history of other endocrine, nutritional and metabolic disease: Secondary | ICD-10-CM | POA: Diagnosis not present

## 2018-01-22 DIAGNOSIS — E039 Hypothyroidism, unspecified: Secondary | ICD-10-CM | POA: Diagnosis not present

## 2018-01-22 DIAGNOSIS — Z8639 Personal history of other endocrine, nutritional and metabolic disease: Secondary | ICD-10-CM | POA: Diagnosis not present

## 2018-02-23 DIAGNOSIS — Z0000 Encounter for general adult medical examination without abnormal findings: Secondary | ICD-10-CM | POA: Diagnosis not present

## 2018-02-23 DIAGNOSIS — Z13220 Encounter for screening for lipoid disorders: Secondary | ICD-10-CM | POA: Diagnosis not present

## 2018-03-06 DIAGNOSIS — M5441 Lumbago with sciatica, right side: Secondary | ICD-10-CM | POA: Diagnosis not present

## 2018-03-06 DIAGNOSIS — E039 Hypothyroidism, unspecified: Secondary | ICD-10-CM | POA: Diagnosis not present

## 2018-03-06 DIAGNOSIS — Z0000 Encounter for general adult medical examination without abnormal findings: Secondary | ICD-10-CM | POA: Diagnosis not present

## 2018-03-06 DIAGNOSIS — G8929 Other chronic pain: Secondary | ICD-10-CM | POA: Diagnosis not present

## 2018-03-30 DIAGNOSIS — M9904 Segmental and somatic dysfunction of sacral region: Secondary | ICD-10-CM | POA: Diagnosis not present

## 2018-03-30 DIAGNOSIS — M9902 Segmental and somatic dysfunction of thoracic region: Secondary | ICD-10-CM | POA: Diagnosis not present

## 2018-03-30 DIAGNOSIS — M5416 Radiculopathy, lumbar region: Secondary | ICD-10-CM | POA: Diagnosis not present

## 2018-03-30 DIAGNOSIS — M9903 Segmental and somatic dysfunction of lumbar region: Secondary | ICD-10-CM | POA: Diagnosis not present

## 2018-04-02 DIAGNOSIS — M5416 Radiculopathy, lumbar region: Secondary | ICD-10-CM | POA: Diagnosis not present

## 2018-04-02 DIAGNOSIS — M9903 Segmental and somatic dysfunction of lumbar region: Secondary | ICD-10-CM | POA: Diagnosis not present

## 2018-04-02 DIAGNOSIS — M9902 Segmental and somatic dysfunction of thoracic region: Secondary | ICD-10-CM | POA: Diagnosis not present

## 2018-04-02 DIAGNOSIS — M9904 Segmental and somatic dysfunction of sacral region: Secondary | ICD-10-CM | POA: Diagnosis not present

## 2018-04-05 DIAGNOSIS — M5416 Radiculopathy, lumbar region: Secondary | ICD-10-CM | POA: Diagnosis not present

## 2018-04-05 DIAGNOSIS — M9902 Segmental and somatic dysfunction of thoracic region: Secondary | ICD-10-CM | POA: Diagnosis not present

## 2018-04-05 DIAGNOSIS — M9903 Segmental and somatic dysfunction of lumbar region: Secondary | ICD-10-CM | POA: Diagnosis not present

## 2018-04-05 DIAGNOSIS — M9904 Segmental and somatic dysfunction of sacral region: Secondary | ICD-10-CM | POA: Diagnosis not present

## 2018-04-12 DIAGNOSIS — M9903 Segmental and somatic dysfunction of lumbar region: Secondary | ICD-10-CM | POA: Diagnosis not present

## 2018-04-12 DIAGNOSIS — M9904 Segmental and somatic dysfunction of sacral region: Secondary | ICD-10-CM | POA: Diagnosis not present

## 2018-04-12 DIAGNOSIS — M5416 Radiculopathy, lumbar region: Secondary | ICD-10-CM | POA: Diagnosis not present

## 2018-04-12 DIAGNOSIS — M9902 Segmental and somatic dysfunction of thoracic region: Secondary | ICD-10-CM | POA: Diagnosis not present

## 2018-04-19 DIAGNOSIS — M9903 Segmental and somatic dysfunction of lumbar region: Secondary | ICD-10-CM | POA: Diagnosis not present

## 2018-04-19 DIAGNOSIS — M9904 Segmental and somatic dysfunction of sacral region: Secondary | ICD-10-CM | POA: Diagnosis not present

## 2018-04-19 DIAGNOSIS — M5416 Radiculopathy, lumbar region: Secondary | ICD-10-CM | POA: Diagnosis not present

## 2018-04-19 DIAGNOSIS — M9902 Segmental and somatic dysfunction of thoracic region: Secondary | ICD-10-CM | POA: Diagnosis not present

## 2018-04-26 DIAGNOSIS — M9902 Segmental and somatic dysfunction of thoracic region: Secondary | ICD-10-CM | POA: Diagnosis not present

## 2018-04-26 DIAGNOSIS — M9904 Segmental and somatic dysfunction of sacral region: Secondary | ICD-10-CM | POA: Diagnosis not present

## 2018-04-26 DIAGNOSIS — M5416 Radiculopathy, lumbar region: Secondary | ICD-10-CM | POA: Diagnosis not present

## 2018-04-26 DIAGNOSIS — M9903 Segmental and somatic dysfunction of lumbar region: Secondary | ICD-10-CM | POA: Diagnosis not present

## 2018-04-27 IMAGING — US US THYROID
1 series · 14 of 25 positions shown · non-contrast
Comparison: None.

CLINICAL DATA: Hypothyroid.

EXAM:
THYROID ULTRASOUND
TECHNIQUE: Ultrasound examination of the thyroid gland and adjacent soft
tissues was performed.

[Series 1: us thyroid · 0.07mm/px · 14 of 45 slices shown]
[im 1/45]
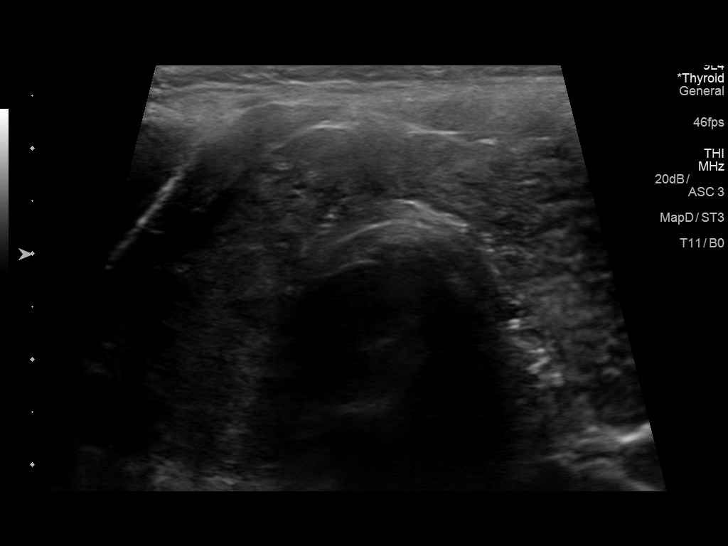
[im 4/45]
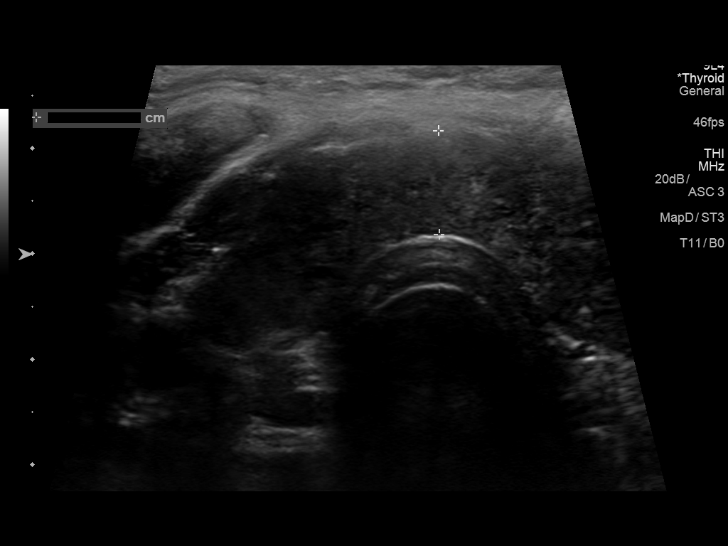
[im 8/45]
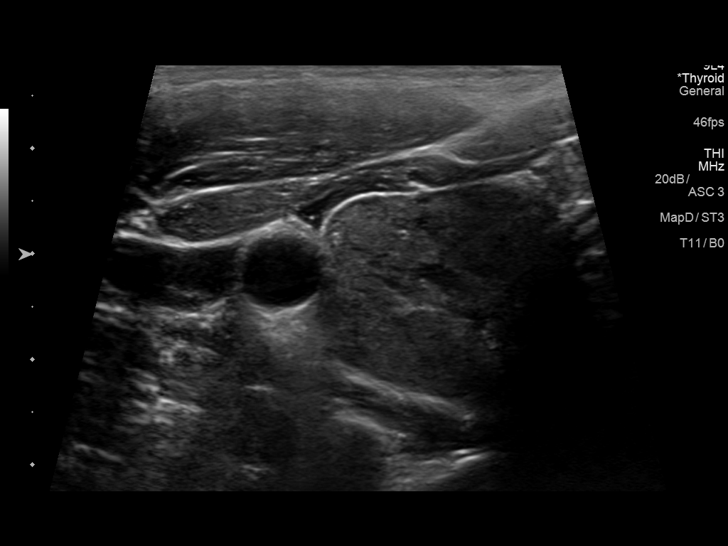
[im 12/45]
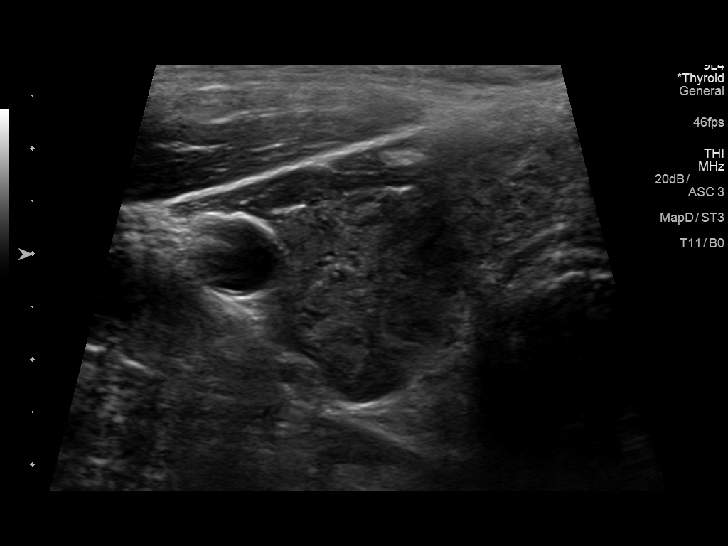
[im 15/45]
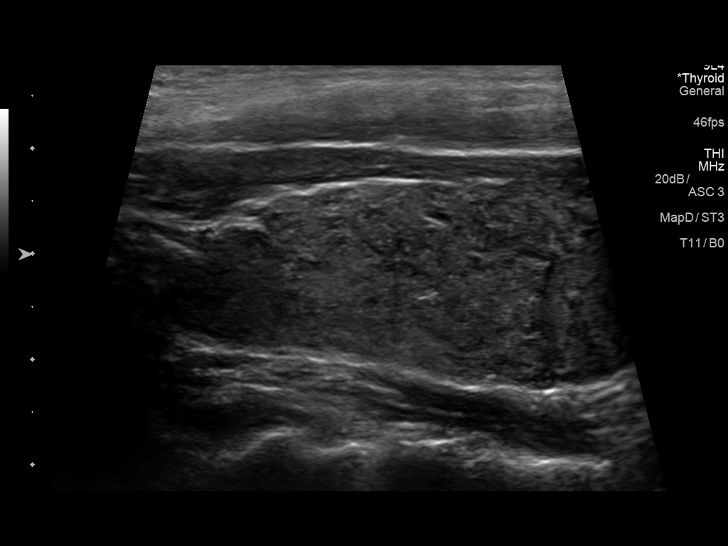
[im 17/45]
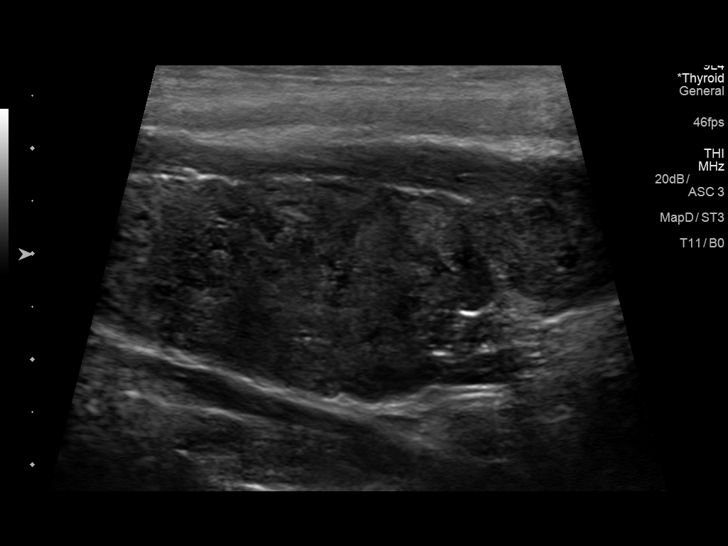
[im 21/45]
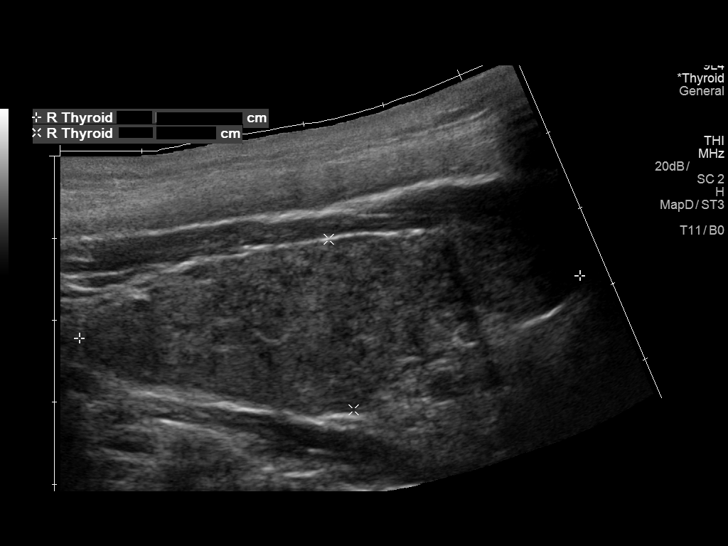
[im 24/45]
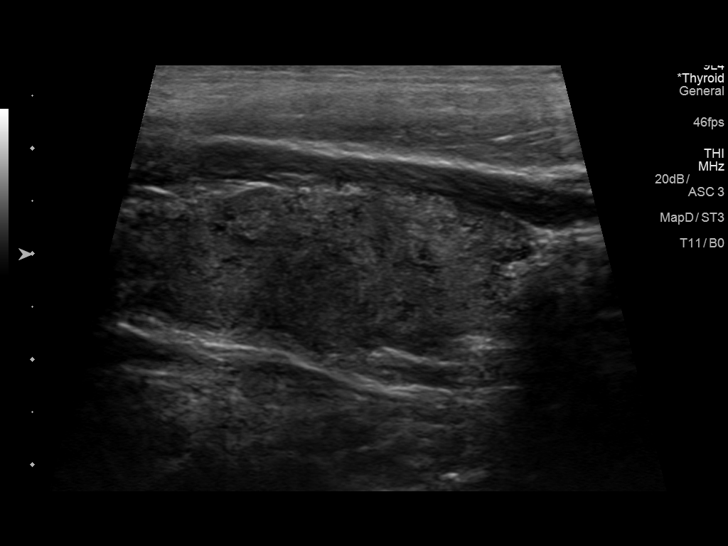
[im 28/45]
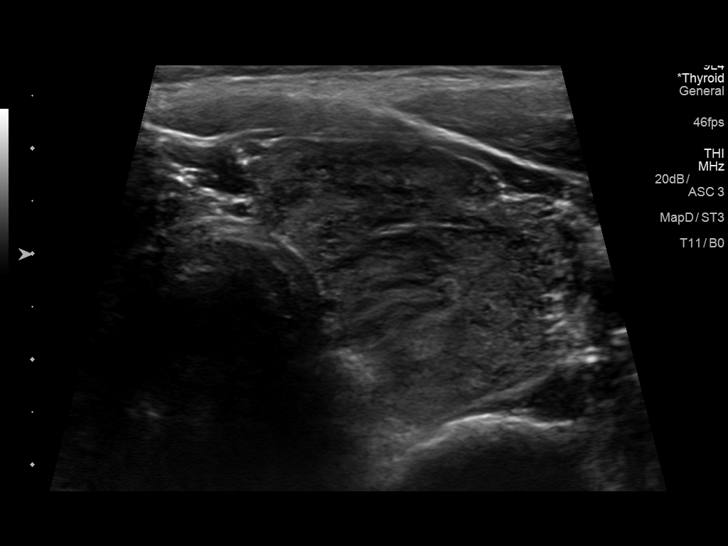
[im 30/45]
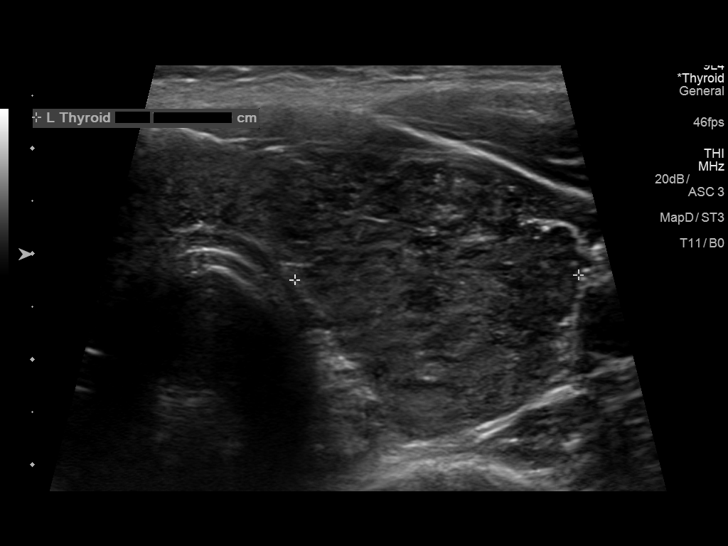
[im 34/45]
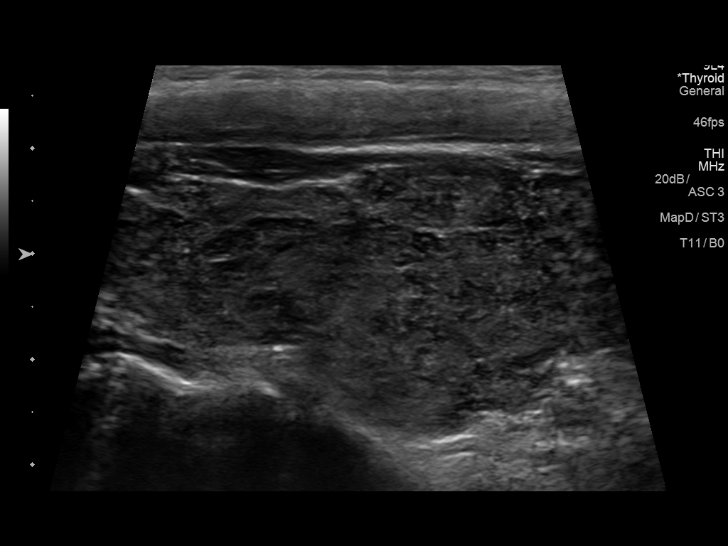
[im 37/45]
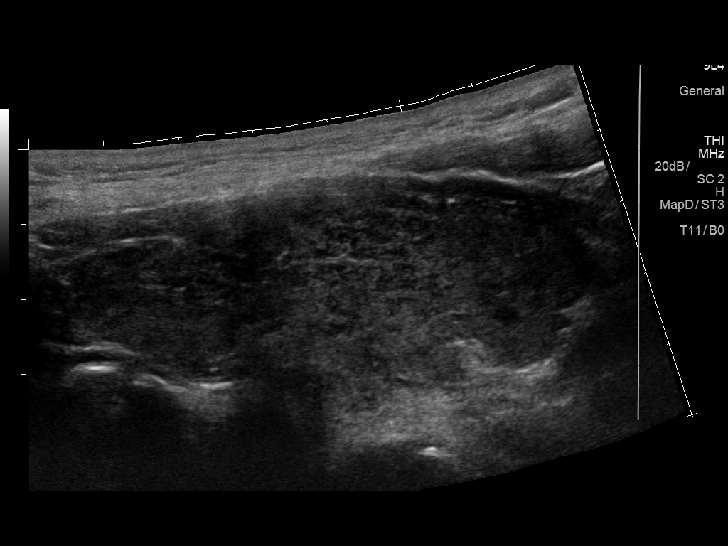
[im 41/45]
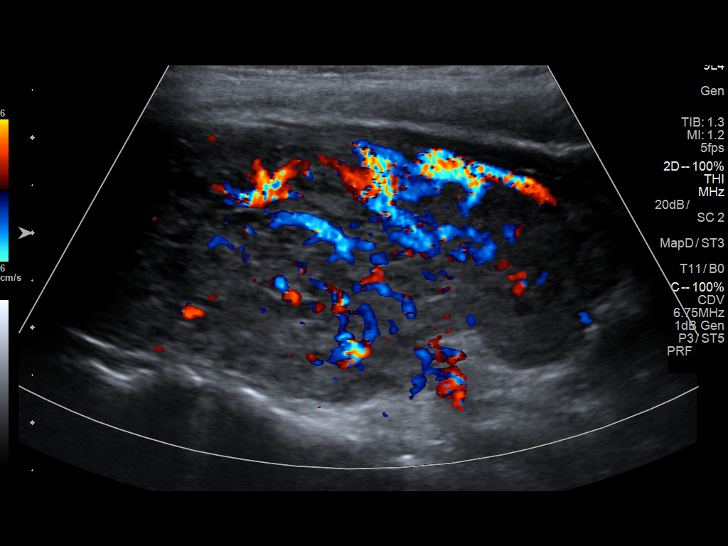
[im 45/45]
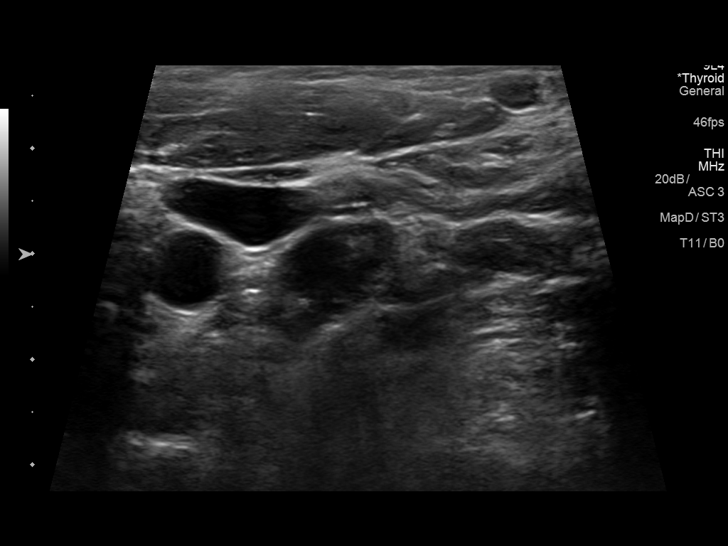

[14 of 25 positions shown; findings below may reference images not displayed]

FINDINGS: Parenchymal Echotexture: Markedly heterogenous

Isthmus: 1.0 cm in the AP dimension.

Right lobe: 6.1 x 2.1 x 2.4 cm

Left lobe: 7.0 x 2.9 x 2.7 cm

_________________________________________________________

Estimated total number of nodules >/= 1 cm: 0

Number of spongiform nodules >/=  2 cm not described below (TR1): 0

Number of mixed cystic and solid nodules >/= 1.5 cm not described
below (TR2): 0

No discrete nodules are seen within the thyroid gland.
IMPRESSION: Thyroid tissue is enlarged and diffusely heterogeneous. No discrete
nodules.

## 2018-04-28 DIAGNOSIS — E039 Hypothyroidism, unspecified: Secondary | ICD-10-CM | POA: Diagnosis not present

## 2018-06-20 DIAGNOSIS — E039 Hypothyroidism, unspecified: Secondary | ICD-10-CM | POA: Diagnosis not present

## 2018-12-04 DIAGNOSIS — E039 Hypothyroidism, unspecified: Secondary | ICD-10-CM | POA: Diagnosis not present

## 2018-12-04 DIAGNOSIS — Z8639 Personal history of other endocrine, nutritional and metabolic disease: Secondary | ICD-10-CM | POA: Diagnosis not present

## 2018-12-10 DIAGNOSIS — Z7189 Other specified counseling: Secondary | ICD-10-CM | POA: Diagnosis not present

## 2018-12-10 DIAGNOSIS — Z6830 Body mass index (BMI) 30.0-30.9, adult: Secondary | ICD-10-CM | POA: Diagnosis not present

## 2018-12-10 DIAGNOSIS — Z8639 Personal history of other endocrine, nutritional and metabolic disease: Secondary | ICD-10-CM | POA: Diagnosis not present

## 2018-12-10 DIAGNOSIS — E039 Hypothyroidism, unspecified: Secondary | ICD-10-CM | POA: Diagnosis not present

## 2018-12-14 DIAGNOSIS — R009 Unspecified abnormalities of heart beat: Secondary | ICD-10-CM | POA: Diagnosis not present

## 2018-12-14 DIAGNOSIS — E039 Hypothyroidism, unspecified: Secondary | ICD-10-CM | POA: Diagnosis not present

## 2019-01-11 DIAGNOSIS — M9904 Segmental and somatic dysfunction of sacral region: Secondary | ICD-10-CM | POA: Diagnosis not present

## 2019-01-11 DIAGNOSIS — M5416 Radiculopathy, lumbar region: Secondary | ICD-10-CM | POA: Diagnosis not present

## 2019-01-11 DIAGNOSIS — M9902 Segmental and somatic dysfunction of thoracic region: Secondary | ICD-10-CM | POA: Diagnosis not present

## 2019-01-11 DIAGNOSIS — M9903 Segmental and somatic dysfunction of lumbar region: Secondary | ICD-10-CM | POA: Diagnosis not present

## 2019-01-14 DIAGNOSIS — M9904 Segmental and somatic dysfunction of sacral region: Secondary | ICD-10-CM | POA: Diagnosis not present

## 2019-01-14 DIAGNOSIS — M5416 Radiculopathy, lumbar region: Secondary | ICD-10-CM | POA: Diagnosis not present

## 2019-01-14 DIAGNOSIS — M9903 Segmental and somatic dysfunction of lumbar region: Secondary | ICD-10-CM | POA: Diagnosis not present

## 2019-01-14 DIAGNOSIS — M9902 Segmental and somatic dysfunction of thoracic region: Secondary | ICD-10-CM | POA: Diagnosis not present

## 2019-01-17 DIAGNOSIS — E039 Hypothyroidism, unspecified: Secondary | ICD-10-CM | POA: Diagnosis not present

## 2019-01-17 DIAGNOSIS — M9903 Segmental and somatic dysfunction of lumbar region: Secondary | ICD-10-CM | POA: Diagnosis not present

## 2019-01-17 DIAGNOSIS — M9904 Segmental and somatic dysfunction of sacral region: Secondary | ICD-10-CM | POA: Diagnosis not present

## 2019-01-17 DIAGNOSIS — M9902 Segmental and somatic dysfunction of thoracic region: Secondary | ICD-10-CM | POA: Diagnosis not present

## 2019-01-17 DIAGNOSIS — M5416 Radiculopathy, lumbar region: Secondary | ICD-10-CM | POA: Diagnosis not present

## 2019-01-21 DIAGNOSIS — M5416 Radiculopathy, lumbar region: Secondary | ICD-10-CM | POA: Diagnosis not present

## 2019-01-21 DIAGNOSIS — M9904 Segmental and somatic dysfunction of sacral region: Secondary | ICD-10-CM | POA: Diagnosis not present

## 2019-01-21 DIAGNOSIS — M9902 Segmental and somatic dysfunction of thoracic region: Secondary | ICD-10-CM | POA: Diagnosis not present

## 2019-01-21 DIAGNOSIS — M9903 Segmental and somatic dysfunction of lumbar region: Secondary | ICD-10-CM | POA: Diagnosis not present

## 2019-01-22 DIAGNOSIS — Z8639 Personal history of other endocrine, nutritional and metabolic disease: Secondary | ICD-10-CM | POA: Diagnosis not present

## 2019-01-22 DIAGNOSIS — Z7189 Other specified counseling: Secondary | ICD-10-CM | POA: Diagnosis not present

## 2019-01-22 DIAGNOSIS — Z6830 Body mass index (BMI) 30.0-30.9, adult: Secondary | ICD-10-CM | POA: Diagnosis not present

## 2019-01-22 DIAGNOSIS — E039 Hypothyroidism, unspecified: Secondary | ICD-10-CM | POA: Diagnosis not present

## 2019-01-23 DIAGNOSIS — Z8639 Personal history of other endocrine, nutritional and metabolic disease: Secondary | ICD-10-CM | POA: Diagnosis not present

## 2019-01-23 DIAGNOSIS — E039 Hypothyroidism, unspecified: Secondary | ICD-10-CM | POA: Diagnosis not present

## 2019-01-24 DIAGNOSIS — M9904 Segmental and somatic dysfunction of sacral region: Secondary | ICD-10-CM | POA: Diagnosis not present

## 2019-01-24 DIAGNOSIS — M9903 Segmental and somatic dysfunction of lumbar region: Secondary | ICD-10-CM | POA: Diagnosis not present

## 2019-01-24 DIAGNOSIS — M9902 Segmental and somatic dysfunction of thoracic region: Secondary | ICD-10-CM | POA: Diagnosis not present

## 2019-01-24 DIAGNOSIS — M5416 Radiculopathy, lumbar region: Secondary | ICD-10-CM | POA: Diagnosis not present

## 2019-01-28 DIAGNOSIS — M9903 Segmental and somatic dysfunction of lumbar region: Secondary | ICD-10-CM | POA: Diagnosis not present

## 2019-01-28 DIAGNOSIS — M9904 Segmental and somatic dysfunction of sacral region: Secondary | ICD-10-CM | POA: Diagnosis not present

## 2019-01-28 DIAGNOSIS — M5416 Radiculopathy, lumbar region: Secondary | ICD-10-CM | POA: Diagnosis not present

## 2019-01-28 DIAGNOSIS — M9902 Segmental and somatic dysfunction of thoracic region: Secondary | ICD-10-CM | POA: Diagnosis not present

## 2019-01-31 DIAGNOSIS — M5416 Radiculopathy, lumbar region: Secondary | ICD-10-CM | POA: Diagnosis not present

## 2019-01-31 DIAGNOSIS — M9902 Segmental and somatic dysfunction of thoracic region: Secondary | ICD-10-CM | POA: Diagnosis not present

## 2019-01-31 DIAGNOSIS — M9904 Segmental and somatic dysfunction of sacral region: Secondary | ICD-10-CM | POA: Diagnosis not present

## 2019-01-31 DIAGNOSIS — M9903 Segmental and somatic dysfunction of lumbar region: Secondary | ICD-10-CM | POA: Diagnosis not present

## 2019-02-04 DIAGNOSIS — M9902 Segmental and somatic dysfunction of thoracic region: Secondary | ICD-10-CM | POA: Diagnosis not present

## 2019-02-04 DIAGNOSIS — M5416 Radiculopathy, lumbar region: Secondary | ICD-10-CM | POA: Diagnosis not present

## 2019-02-04 DIAGNOSIS — M9903 Segmental and somatic dysfunction of lumbar region: Secondary | ICD-10-CM | POA: Diagnosis not present

## 2019-02-04 DIAGNOSIS — M9904 Segmental and somatic dysfunction of sacral region: Secondary | ICD-10-CM | POA: Diagnosis not present

## 2019-03-01 DIAGNOSIS — Z13228 Encounter for screening for other metabolic disorders: Secondary | ICD-10-CM | POA: Diagnosis not present

## 2019-03-01 DIAGNOSIS — E039 Hypothyroidism, unspecified: Secondary | ICD-10-CM | POA: Diagnosis not present

## 2019-03-01 DIAGNOSIS — Z1159 Encounter for screening for other viral diseases: Secondary | ICD-10-CM | POA: Diagnosis not present

## 2019-03-01 DIAGNOSIS — Z191 Hormone sensitive malignancy status: Secondary | ICD-10-CM | POA: Diagnosis not present

## 2019-03-01 DIAGNOSIS — Z79899 Other long term (current) drug therapy: Secondary | ICD-10-CM | POA: Diagnosis not present

## 2019-03-01 DIAGNOSIS — Z125 Encounter for screening for malignant neoplasm of prostate: Secondary | ICD-10-CM | POA: Diagnosis not present

## 2019-03-08 DIAGNOSIS — Z029 Encounter for administrative examinations, unspecified: Secondary | ICD-10-CM | POA: Diagnosis not present

## 2019-03-08 DIAGNOSIS — Z0000 Encounter for general adult medical examination without abnormal findings: Secondary | ICD-10-CM | POA: Diagnosis not present

## 2019-03-08 DIAGNOSIS — Z1211 Encounter for screening for malignant neoplasm of colon: Secondary | ICD-10-CM | POA: Diagnosis not present

## 2019-05-21 ENCOUNTER — Encounter: Payer: Self-pay | Admitting: Physician Assistant

## 2019-06-07 DIAGNOSIS — M545 Low back pain: Secondary | ICD-10-CM | POA: Diagnosis not present

## 2019-07-26 DIAGNOSIS — E039 Hypothyroidism, unspecified: Secondary | ICD-10-CM | POA: Diagnosis not present

## 2019-07-26 DIAGNOSIS — R0789 Other chest pain: Secondary | ICD-10-CM | POA: Diagnosis not present

## 2019-07-26 DIAGNOSIS — R739 Hyperglycemia, unspecified: Secondary | ICD-10-CM | POA: Diagnosis not present

## 2019-07-26 DIAGNOSIS — E785 Hyperlipidemia, unspecified: Secondary | ICD-10-CM | POA: Diagnosis not present

## 2019-07-29 ENCOUNTER — Encounter: Payer: Self-pay | Admitting: Gastroenterology

## 2019-08-06 ENCOUNTER — Encounter: Payer: Self-pay | Admitting: Gastroenterology

## 2019-08-23 ENCOUNTER — Ambulatory Visit: Payer: Self-pay | Admitting: Cardiology

## 2019-09-06 ENCOUNTER — Encounter: Payer: BLUE CROSS/BLUE SHIELD | Admitting: Gastroenterology

## 2019-09-06 ENCOUNTER — Ambulatory Visit: Payer: Self-pay | Admitting: Cardiology

## 2019-09-13 DIAGNOSIS — R0789 Other chest pain: Secondary | ICD-10-CM | POA: Diagnosis not present

## 2019-09-13 DIAGNOSIS — R002 Palpitations: Secondary | ICD-10-CM | POA: Diagnosis not present

## 2019-09-19 ENCOUNTER — Ambulatory Visit: Payer: BC Managed Care – PPO | Admitting: Neurology

## 2019-09-23 DIAGNOSIS — E039 Hypothyroidism, unspecified: Secondary | ICD-10-CM | POA: Diagnosis not present

## 2019-09-23 DIAGNOSIS — E785 Hyperlipidemia, unspecified: Secondary | ICD-10-CM | POA: Diagnosis not present

## 2019-10-03 ENCOUNTER — Ambulatory Visit: Payer: BC Managed Care – PPO | Admitting: Neurology

## 2019-10-25 ENCOUNTER — Encounter: Payer: BLUE CROSS/BLUE SHIELD | Admitting: Gastroenterology

## 2019-11-07 DIAGNOSIS — S0990XA Unspecified injury of head, initial encounter: Secondary | ICD-10-CM | POA: Diagnosis not present

## 2019-11-08 DIAGNOSIS — S0990XA Unspecified injury of head, initial encounter: Secondary | ICD-10-CM | POA: Diagnosis not present

## 2020-02-21 DIAGNOSIS — Z13228 Encounter for screening for other metabolic disorders: Secondary | ICD-10-CM | POA: Diagnosis not present

## 2020-02-21 DIAGNOSIS — E039 Hypothyroidism, unspecified: Secondary | ICD-10-CM | POA: Diagnosis not present

## 2020-02-21 DIAGNOSIS — Z1159 Encounter for screening for other viral diseases: Secondary | ICD-10-CM | POA: Diagnosis not present

## 2020-02-21 DIAGNOSIS — Z125 Encounter for screening for malignant neoplasm of prostate: Secondary | ICD-10-CM | POA: Diagnosis not present

## 2021-04-23 ENCOUNTER — Ambulatory Visit: Payer: Medicaid Other | Admitting: Cardiovascular Disease
